# Patient Record
Sex: Female | Born: 1950
Health system: Southern US, Community
[De-identification: ages and names within clinical notes are randomized; demographics above are authoritative.]

## PROBLEM LIST (undated history)

## (undated) DIAGNOSIS — E7800 Pure hypercholesterolemia, unspecified: Secondary | ICD-10-CM

## (undated) HISTORY — PX: COLONOSCOPY: SHX174

## (undated) HISTORY — PX: TONSILLECTOMY: SUR1361

---

## 2014-09-09 ENCOUNTER — Other Ambulatory Visit: Payer: Self-pay | Admitting: Family Medicine

## 2014-09-09 ENCOUNTER — Ambulatory Visit
Admission: RE | Admit: 2014-09-09 | Discharge: 2014-09-09 | Disposition: A | Discharge status: Home / Self Care | Payer: BLUE CROSS/BLUE SHIELD | Source: Ambulatory Visit | Attending: Family Medicine | Admitting: Family Medicine

## 2014-09-09 DIAGNOSIS — R1900 Intra-abdominal and pelvic swelling, mass and lump, unspecified site: Secondary | ICD-10-CM

## 2016-12-02 ENCOUNTER — Encounter (HOSPITAL_COMMUNITY): Payer: Self-pay | Admitting: *Deleted

## 2016-12-02 ENCOUNTER — Emergency Department (HOSPITAL_COMMUNITY): Payer: BLUE CROSS/BLUE SHIELD

## 2016-12-02 ENCOUNTER — Emergency Department (HOSPITAL_COMMUNITY)
Admission: EM | Admit: 2016-12-02 | Discharge: 2016-12-02 | Disposition: A | Discharge status: Home / Self Care | Payer: BLUE CROSS/BLUE SHIELD | Attending: Emergency Medicine | Admitting: Emergency Medicine

## 2016-12-02 DIAGNOSIS — R002 Palpitations: Secondary | ICD-10-CM | POA: Diagnosis present

## 2016-12-02 DIAGNOSIS — Z79899 Other long term (current) drug therapy: Secondary | ICD-10-CM | POA: Insufficient documentation

## 2016-12-02 HISTORY — DX: Pure hypercholesterolemia, unspecified: E78.00

## 2016-12-02 LAB — T4, FREE: FREE T4: 1.01 ng/dL (ref 0.61–1.12)

## 2016-12-02 LAB — TSH: TSH: 2.881 u[IU]/mL (ref 0.350–4.500)

## 2016-12-02 LAB — CBC
HCT: 42.4 % (ref 36.0–46.0)
HEMOGLOBIN: 14 g/dL (ref 12.0–15.0)
MCH: 31 pg (ref 26.0–34.0)
MCHC: 33 g/dL (ref 30.0–36.0)
MCV: 93.8 fL (ref 78.0–100.0)
PLATELETS: 183 10*3/uL (ref 150–400)
RBC: 4.52 MIL/uL (ref 3.87–5.11)
RDW: 12.6 % (ref 11.5–15.5)
WBC: 5.2 10*3/uL (ref 4.0–10.5)

## 2016-12-02 LAB — BASIC METABOLIC PANEL
ANION GAP: 11 (ref 5–15)
BUN: 11 mg/dL (ref 6–20)
CALCIUM: 9.6 mg/dL (ref 8.9–10.3)
CO2: 26 mmol/L (ref 22–32)
CREATININE: 0.77 mg/dL (ref 0.44–1.00)
Chloride: 103 mmol/L (ref 101–111)
GFR calc Af Amer: >60 mL/min (ref >60)
GFR calc non Af Amer: >60 mL/min (ref >60)
GLUCOSE: 121 mg/dL — AB (ref 65–99)
Potassium: 3.8 mmol/L (ref 3.5–5.1)
Sodium: 140 mmol/L (ref 135–145)

## 2016-12-02 LAB — I-STAT TROPONIN, ED: TROPONIN I, POC: 0 ng/mL (ref 0.00–0.08)

## 2016-12-02 MED ORDER — SODIUM CHLORIDE 0.9 % IV BOLUS (SEPSIS): 1000.0000 mL | Freq: Once | INTRAVENOUS | Status: DC

## 2016-12-02 NOTE — ED Provider Notes (Signed)
[facility] DEPT Provider Note   CSN: [id number] Arrival date & time: 12/02/16  1354     History   Chief Complaint Chief Complaint  Patient presents with  . Palpitations    HPI Crystal Salas is a 66 y.o. female.  The history is provided by the patient.  Palpitations   This is a recurrent problem. The current episode started more than 2 days ago. The problem occurs every several days. The problem has not changed since onset.The problem is associated with caffeine. Pertinent negatives include no fever, no chest pain, no chest pressure, no exertional chest pressure, no near-syncope, no syncope, no abdominal pain, no nausea, no vomiting, no back pain, no leg pain, no lower extremity edema, no cough and no shortness of breath. She has tried nothing for the symptoms.   66 year old female with history of hyperlipidemia who presents with fluttering and in her left upper quadrant and low chest. She has had intermittent symptoms like this for the past 2 years. Has been ruled out for an aortic aneurysm by ultrasound 2 years ago by PCP for this. States the symptoms tend to be worse with certain foods and drinks such as caffeine and spicy foods and acidic foods. Denies any chest pain, difficulty breathing, syncope or near syncope, abdominal pain, nausea or vomiting, diarrhea or urinary complaints. She feels that her symptoms may have been more frequent over the past day or two. Past Medical History:  Diagnosis Date  . High cholesterol     There are no active problems to display for this patient.   History reviewed. No pertinent surgical history.  OB History    No data available       Home Medications    Prior to Admission medications   Medication Sig Start Date End Date Taking? Authorizing Provider  Omega-3 Fatty Acids (FISH OIL) 1000 MG CPDR Take 1,000 mg by mouth daily.   Yes [provider]  pravastatin (PRAVACHOL) 20 MG tablet Take 20 mg by mouth daily.   Yes  [provider]    Family History History reviewed. No pertinent family history.  Social History Social History  Substance Use Topics  . Smoking status: Never Smoker  . Smokeless tobacco: Not on file  . Alcohol use No     Allergies   Erythromycin   Review of Systems Review of Systems  Constitutional: Negative for fever.  Respiratory: Negative for cough and shortness of breath.   Cardiovascular: Positive for palpitations. Negative for chest pain, syncope and near-syncope.  Gastrointestinal: Negative for abdominal pain, nausea and vomiting.  Musculoskeletal: Negative for back pain.  All other systems reviewed and are negative.    Physical Exam Updated Vital Signs BP 96/68 (BP Location: Right Arm)   Pulse 70   Temp 98.3 F (36.8 C) (Oral)   Resp 20   SpO2 99%   Physical Exam Physical Exam  Nursing note and vitals reviewed. Constitutional: Well developed, well nourished, non-toxic, and in no acute distress Head: Normocephalic and atraumatic.  Mouth/Throat: Oropharynx is clear and moist.  Neck: Normal range of motion. Neck supple.  Cardiovascular: Normal rate and regular rhythm.   Pulmonary/Chest: Effort normal and breath sounds normal.  Abdominal: Soft. There is no tenderness. There is no rebound and no guarding.  Musculoskeletal: Normal range of motion.  Neurological: Alert, no facial droop, fluent speech, moves all extremities symmetrically Skin: Skin is warm and dry.  Psychiatric: Cooperative   ED Treatments / Results  Labs (all labs ordered  are listed, but only abnormal results are displayed) Labs Reviewed  BASIC METABOLIC PANEL - Abnormal; Notable for the following:       Result Value   Glucose, Bld 121 (*)    All other components within normal limits  CBC  TSH  T4, FREE  I-STAT TROPONIN, ED    EKG  EKG Interpretation  Date/Time:  Saturday December 02 2016 14:01:28 EDT Ventricular Rate:  70 PR Interval:  146 QRS Duration: 118 QT  Interval:  406 QTC Calculation: 438 R Axis:   79 Text Interpretation:  Normal sinus rhythm Possible Left atrial enlargement Incomplete right bundle branch block Borderline ECG no prior EKG Confirmed by Brantley Stage [phone]) on 12/02/2016 5:09:19 PM       Radiology Dg Chest 2 View  Result Date: 12/02/2016 CLINICAL DATA:  Palpitations for 2 days EXAM: CHEST  2 VIEW COMPARISON:  None. FINDINGS: Normal heart size and mediastinal contours. No acute infiltrate. Blunting of the lateral costophrenic sulci as attributed to degree of inflation. No fluid seen laterally. No edema or pneumothorax. No acute osseous findings. IMPRESSION: Negative chest. Electronically Signed   By: Monte Fantasia M.D.   On: 12/02/2016 15:45    Procedures Procedures (including critical care time)  Medications Ordered in ED Medications - No data to display   Initial Impression / Assessment and Plan / ED Course  I have reviewed the triage vital signs and the nursing notes.  Pertinent labs & imaging results that were available during my care of the patient were reviewed by me and considered in my medical decision making (see chart for details).     66 year old female who presents with intermittent palpitations in the left lower chest and left upper quadrant over the past 2 years. She is well-appearing and in no acute distress. Her vitals are stable. She did have the sensation while she was on cardiac monitoring and EKG does not show arrhythmia. There is incomplete right bundle-branch block, but otherwise no acute ischemic changes or other stigmata of arrhythmia. Blood work reassuring. Chest x-ray visualized and shows no acute cardiopulmonary processes. Her symptoms actually may be GI related as associated with specific foods. I have discussed potential medication such as PPIs with her but she would like to try diet changes first and follow-up closely with her PCP. I do not think that this is an acute cardiac process or there is  serious or emergent process. She has been ruled out for abdominal aortic aneurysm already as outpatient by PCP. Strict return and follow-up instructions reviewed. She expressed understanding of all discharge instructions and felt comfortable with the plan of care.   Final Clinical Impressions(s) / ED Diagnoses   Final diagnoses:  Palpitations    New Prescriptions New Prescriptions   No medications on file     Forde Dandy, MD 12/02/16 1850

## 2016-12-02 NOTE — Discharge Instructions (Signed)
Your palpitations may be related to indigestion symptoms. Cut back on acidic foods, caffeine, spicy foods and see if there is a difference. Please touch base with your PCP for follow-up and whether if you need Holter monitoring or other work-up Your blood work looks reassuring today. Your chest x-ray was normal.

## 2016-12-02 NOTE — ED Notes (Signed)
Resident at bedside.  

## 2016-12-02 NOTE — ED Triage Notes (Signed)
Pt reports hx of palpitations over past two years but has been more severe since Wed. Denies sob or CP. Skin w/d. ekg done at triage.

## 2017-01-24 ENCOUNTER — Other Ambulatory Visit: Payer: Self-pay | Admitting: Cardiology

## 2017-01-24 DIAGNOSIS — R1900 Intra-abdominal and pelvic swelling, mass and lump, unspecified site: Secondary | ICD-10-CM

## 2017-01-25 ENCOUNTER — Other Ambulatory Visit: Payer: Self-pay | Admitting: Cardiology

## 2017-02-09 ENCOUNTER — Ambulatory Visit
Admission: RE | Admit: 2017-02-09 | Discharge: 2017-02-09 | Disposition: A | Discharge status: Home / Self Care | Payer: BLUE CROSS/BLUE SHIELD | Source: Ambulatory Visit | Attending: Cardiology | Admitting: Cardiology

## 2017-02-09 DIAGNOSIS — R1900 Intra-abdominal and pelvic swelling, mass and lump, unspecified site: Secondary | ICD-10-CM

## 2017-12-13 DIAGNOSIS — Z01419 Encounter for gynecological examination (general) (routine) without abnormal findings: Secondary | ICD-10-CM | POA: Diagnosis not present

## 2018-02-06 DIAGNOSIS — Z23 Encounter for immunization: Secondary | ICD-10-CM | POA: Diagnosis not present

## 2018-02-06 DIAGNOSIS — Z0000 Encounter for general adult medical examination without abnormal findings: Secondary | ICD-10-CM | POA: Diagnosis not present

## 2018-02-06 DIAGNOSIS — M8580 Other specified disorders of bone density and structure, unspecified site: Secondary | ICD-10-CM | POA: Diagnosis not present

## 2018-02-06 DIAGNOSIS — E2839 Other primary ovarian failure: Secondary | ICD-10-CM | POA: Diagnosis not present

## 2018-02-06 DIAGNOSIS — Z1389 Encounter for screening for other disorder: Secondary | ICD-10-CM | POA: Diagnosis not present

## 2018-02-06 DIAGNOSIS — M25511 Pain in right shoulder: Secondary | ICD-10-CM | POA: Diagnosis not present

## 2018-02-06 DIAGNOSIS — Z6824 Body mass index (BMI) 24.0-24.9, adult: Secondary | ICD-10-CM | POA: Diagnosis not present

## 2018-02-06 DIAGNOSIS — K219 Gastro-esophageal reflux disease without esophagitis: Secondary | ICD-10-CM | POA: Diagnosis not present

## 2018-02-06 DIAGNOSIS — E7800 Pure hypercholesterolemia, unspecified: Secondary | ICD-10-CM | POA: Diagnosis not present

## 2018-03-04 DIAGNOSIS — M47812 Spondylosis without myelopathy or radiculopathy, cervical region: Secondary | ICD-10-CM | POA: Diagnosis not present

## 2018-03-04 DIAGNOSIS — M25512 Pain in left shoulder: Secondary | ICD-10-CM | POA: Diagnosis not present

## 2018-03-12 DIAGNOSIS — J069 Acute upper respiratory infection, unspecified: Secondary | ICD-10-CM | POA: Diagnosis not present

## 2018-04-25 DIAGNOSIS — M25512 Pain in left shoulder: Secondary | ICD-10-CM | POA: Diagnosis not present

## 2018-04-25 DIAGNOSIS — M542 Cervicalgia: Secondary | ICD-10-CM | POA: Diagnosis not present

## 2018-06-10 DIAGNOSIS — Z1231 Encounter for screening mammogram for malignant neoplasm of breast: Secondary | ICD-10-CM | POA: Diagnosis not present

## 2018-06-11 ENCOUNTER — Other Ambulatory Visit: Payer: Self-pay | Admitting: Family Medicine

## 2018-06-11 DIAGNOSIS — E2839 Other primary ovarian failure: Secondary | ICD-10-CM

## 2018-06-13 DIAGNOSIS — L600 Ingrowing nail: Secondary | ICD-10-CM | POA: Diagnosis not present

## 2018-06-13 DIAGNOSIS — M79674 Pain in right toe(s): Secondary | ICD-10-CM | POA: Diagnosis not present

## 2018-08-23 ENCOUNTER — Other Ambulatory Visit: Payer: BLUE CROSS/BLUE SHIELD

## 2018-10-21 ENCOUNTER — Other Ambulatory Visit: Payer: Self-pay

## 2018-10-21 ENCOUNTER — Ambulatory Visit
Admission: RE | Admit: 2018-10-21 | Discharge: 2018-10-21 | Disposition: A | Discharge status: Home / Self Care | Payer: BLUE CROSS/BLUE SHIELD | Source: Ambulatory Visit | Attending: Family Medicine | Admitting: Family Medicine

## 2018-10-21 DIAGNOSIS — E2839 Other primary ovarian failure: Secondary | ICD-10-CM

## 2018-10-21 DIAGNOSIS — M85852 Other specified disorders of bone density and structure, left thigh: Secondary | ICD-10-CM | POA: Diagnosis not present

## 2018-10-22 DIAGNOSIS — H524 Presbyopia: Secondary | ICD-10-CM | POA: Diagnosis not present

## 2018-10-22 DIAGNOSIS — H5203 Hypermetropia, bilateral: Secondary | ICD-10-CM | POA: Diagnosis not present

## 2018-10-22 DIAGNOSIS — Z135 Encounter for screening for eye and ear disorders: Secondary | ICD-10-CM | POA: Diagnosis not present

## 2018-10-22 DIAGNOSIS — H2513 Age-related nuclear cataract, bilateral: Secondary | ICD-10-CM | POA: Diagnosis not present

## 2018-10-22 DIAGNOSIS — H52223 Regular astigmatism, bilateral: Secondary | ICD-10-CM | POA: Diagnosis not present

## 2018-10-22 DIAGNOSIS — H43812 Vitreous degeneration, left eye: Secondary | ICD-10-CM | POA: Diagnosis not present

## 2019-01-01 DIAGNOSIS — Z01419 Encounter for gynecological examination (general) (routine) without abnormal findings: Secondary | ICD-10-CM | POA: Diagnosis not present

## 2019-02-05 DIAGNOSIS — Z23 Encounter for immunization: Secondary | ICD-10-CM | POA: Diagnosis not present

## 2019-03-03 DIAGNOSIS — E2839 Other primary ovarian failure: Secondary | ICD-10-CM | POA: Diagnosis not present

## 2019-03-03 DIAGNOSIS — Z1389 Encounter for screening for other disorder: Secondary | ICD-10-CM | POA: Diagnosis not present

## 2019-03-03 DIAGNOSIS — Z0000 Encounter for general adult medical examination without abnormal findings: Secondary | ICD-10-CM | POA: Diagnosis not present

## 2019-03-03 DIAGNOSIS — M25511 Pain in right shoulder: Secondary | ICD-10-CM | POA: Diagnosis not present

## 2019-03-03 DIAGNOSIS — Z23 Encounter for immunization: Secondary | ICD-10-CM | POA: Diagnosis not present

## 2019-03-03 DIAGNOSIS — K219 Gastro-esophageal reflux disease without esophagitis: Secondary | ICD-10-CM | POA: Diagnosis not present

## 2019-03-03 DIAGNOSIS — E7800 Pure hypercholesterolemia, unspecified: Secondary | ICD-10-CM | POA: Diagnosis not present

## 2019-03-03 DIAGNOSIS — Z6824 Body mass index (BMI) 24.0-24.9, adult: Secondary | ICD-10-CM | POA: Diagnosis not present

## 2019-03-03 DIAGNOSIS — Z1211 Encounter for screening for malignant neoplasm of colon: Secondary | ICD-10-CM | POA: Diagnosis not present

## 2019-03-03 DIAGNOSIS — R0989 Other specified symptoms and signs involving the circulatory and respiratory systems: Secondary | ICD-10-CM | POA: Diagnosis not present

## 2019-03-03 DIAGNOSIS — M8580 Other specified disorders of bone density and structure, unspecified site: Secondary | ICD-10-CM | POA: Diagnosis not present

## 2019-03-05 ENCOUNTER — Other Ambulatory Visit: Payer: Self-pay | Admitting: Family Medicine

## 2019-03-05 DIAGNOSIS — R0989 Other specified symptoms and signs involving the circulatory and respiratory systems: Secondary | ICD-10-CM

## 2019-03-06 ENCOUNTER — Encounter (INDEPENDENT_AMBULATORY_CARE_PROVIDER_SITE_OTHER): Payer: Self-pay | Admitting: *Deleted

## 2019-03-12 ENCOUNTER — Other Ambulatory Visit: Payer: Medicare Other

## 2019-03-25 DIAGNOSIS — L820 Inflamed seborrheic keratosis: Secondary | ICD-10-CM | POA: Diagnosis not present

## 2019-03-25 DIAGNOSIS — D1801 Hemangioma of skin and subcutaneous tissue: Secondary | ICD-10-CM | POA: Diagnosis not present

## 2019-03-25 DIAGNOSIS — D2262 Melanocytic nevi of left upper limb, including shoulder: Secondary | ICD-10-CM | POA: Diagnosis not present

## 2019-03-25 DIAGNOSIS — L570 Actinic keratosis: Secondary | ICD-10-CM | POA: Diagnosis not present

## 2019-03-25 DIAGNOSIS — L821 Other seborrheic keratosis: Secondary | ICD-10-CM | POA: Diagnosis not present

## 2019-03-25 DIAGNOSIS — L819 Disorder of pigmentation, unspecified: Secondary | ICD-10-CM | POA: Diagnosis not present

## 2019-03-25 DIAGNOSIS — L813 Cafe au lait spots: Secondary | ICD-10-CM | POA: Diagnosis not present

## 2019-03-25 DIAGNOSIS — D2261 Melanocytic nevi of right upper limb, including shoulder: Secondary | ICD-10-CM | POA: Diagnosis not present

## 2019-03-25 DIAGNOSIS — D225 Melanocytic nevi of trunk: Secondary | ICD-10-CM | POA: Diagnosis not present

## 2019-03-25 DIAGNOSIS — L720 Epidermal cyst: Secondary | ICD-10-CM | POA: Diagnosis not present

## 2019-03-25 DIAGNOSIS — L814 Other melanin hyperpigmentation: Secondary | ICD-10-CM | POA: Diagnosis not present

## 2019-04-02 ENCOUNTER — Ambulatory Visit
Admission: RE | Admit: 2019-04-02 | Discharge: 2019-04-02 | Disposition: A | Discharge status: Home / Self Care | Payer: Medicare Other | Source: Ambulatory Visit | Attending: Family Medicine | Admitting: Family Medicine

## 2019-04-02 ENCOUNTER — Other Ambulatory Visit: Payer: Self-pay | Admitting: Family Medicine

## 2019-04-02 DIAGNOSIS — R0989 Other specified symptoms and signs involving the circulatory and respiratory systems: Secondary | ICD-10-CM | POA: Diagnosis not present

## 2019-05-08 DIAGNOSIS — R233 Spontaneous ecchymoses: Secondary | ICD-10-CM | POA: Diagnosis not present

## 2019-05-30 DIAGNOSIS — Z23 Encounter for immunization: Secondary | ICD-10-CM | POA: Diagnosis not present

## 2019-06-18 DIAGNOSIS — Z23 Encounter for immunization: Secondary | ICD-10-CM | POA: Diagnosis not present

## 2019-08-07 DIAGNOSIS — Z1231 Encounter for screening mammogram for malignant neoplasm of breast: Secondary | ICD-10-CM | POA: Diagnosis not present

## 2019-08-27 ENCOUNTER — Other Ambulatory Visit (INDEPENDENT_AMBULATORY_CARE_PROVIDER_SITE_OTHER): Payer: Self-pay | Admitting: *Deleted

## 2019-08-28 ENCOUNTER — Other Ambulatory Visit (INDEPENDENT_AMBULATORY_CARE_PROVIDER_SITE_OTHER): Payer: Self-pay | Admitting: *Deleted

## 2019-08-28 DIAGNOSIS — Z1211 Encounter for screening for malignant neoplasm of colon: Secondary | ICD-10-CM

## 2019-08-28 DIAGNOSIS — L03031 Cellulitis of right toe: Secondary | ICD-10-CM | POA: Diagnosis not present

## 2019-08-28 DIAGNOSIS — G5761 Lesion of plantar nerve, right lower limb: Secondary | ICD-10-CM | POA: Diagnosis not present

## 2019-08-28 DIAGNOSIS — M79671 Pain in right foot: Secondary | ICD-10-CM | POA: Diagnosis not present

## 2019-09-02 ENCOUNTER — Encounter (INDEPENDENT_AMBULATORY_CARE_PROVIDER_SITE_OTHER): Payer: Self-pay | Admitting: *Deleted

## 2019-09-03 ENCOUNTER — Ambulatory Visit (INDEPENDENT_AMBULATORY_CARE_PROVIDER_SITE_OTHER): Payer: Self-pay

## 2019-09-03 ENCOUNTER — Other Ambulatory Visit: Payer: Self-pay

## 2019-09-03 ENCOUNTER — Telehealth (INDEPENDENT_AMBULATORY_CARE_PROVIDER_SITE_OTHER): Payer: Self-pay | Admitting: *Deleted

## 2019-09-03 NOTE — Telephone Encounter (Signed)
Referring MD/PCP: reade   Procedure: tcs  Reason/Indication:  screening  Has patient had this procedure before?  Yes, 2010  If so, when, by whom and where?    Is there a family history of colon cancer?  no  Who?  What age when diagnosed?    Is patient diabetic?   no      Does patient have prosthetic heart valve or mechanical valve?  no  Do you have a pacemaker/defibrillator?  no  Has patient ever had endocarditis/atrial fibrillation? no  Does patient use oxygen? no  Has patient had joint replacement within last 12 months?  no  Is patient constipated or do they take laxatives? no  Does patient have a history of alcohol/drug use?  no  Is patient on blood thinner such as Coumadin, Plavix and/or Aspirin? no  Medications: pravastatin 20 mg daily, centrum silver daily, lutein daily, fish oil daily, zinc daily, vit d3 daily, probiotic daily, caltrate daily  Allergies: erythromycin  Medication Adjustment per Dr Laural Golden:   Procedure date & time: 10/01/19

## 2019-09-05 ENCOUNTER — Telehealth (INDEPENDENT_AMBULATORY_CARE_PROVIDER_SITE_OTHER): Payer: Self-pay | Admitting: *Deleted

## 2019-09-05 MED ORDER — SUTAB 1479-225-188 MG PO TABS: 1.0000 | ORAL_TABLET | Freq: Once | ORAL | 0 refills | Status: AC

## 2019-09-05 NOTE — Telephone Encounter (Signed)
Ok to schedule with conscious sedation

## 2019-09-05 NOTE — Telephone Encounter (Signed)
Patient needs Sutab (copay card) ° °

## 2019-09-30 ENCOUNTER — Other Ambulatory Visit: Payer: Self-pay

## 2019-09-30 ENCOUNTER — Other Ambulatory Visit (HOSPITAL_COMMUNITY)
Admission: RE | Admit: 2019-09-30 | Discharge: 2019-09-30 | Disposition: A | Discharge status: Home / Self Care | Payer: Medicare Other | Source: Ambulatory Visit | Attending: Internal Medicine | Admitting: Internal Medicine

## 2019-09-30 DIAGNOSIS — Z01812 Encounter for preprocedural laboratory examination: Secondary | ICD-10-CM | POA: Insufficient documentation

## 2019-09-30 DIAGNOSIS — Z20822 Contact with and (suspected) exposure to covid-19: Secondary | ICD-10-CM | POA: Diagnosis not present

## 2019-10-01 ENCOUNTER — Other Ambulatory Visit: Payer: Self-pay

## 2019-10-01 ENCOUNTER — Encounter (HOSPITAL_COMMUNITY)
Admission: RE | Disposition: A | Discharge status: Home / Self Care | Payer: Self-pay | Source: Home / Self Care | Attending: Internal Medicine

## 2019-10-01 ENCOUNTER — Ambulatory Visit (HOSPITAL_COMMUNITY)
Admission: RE | Admit: 2019-10-01 | Discharge: 2019-10-01 | Disposition: A | Discharge status: Home / Self Care | Payer: Medicare Other | Attending: Internal Medicine | Admitting: Internal Medicine

## 2019-10-01 ENCOUNTER — Encounter (HOSPITAL_COMMUNITY): Payer: Self-pay | Admitting: Internal Medicine

## 2019-10-01 DIAGNOSIS — Z1211 Encounter for screening for malignant neoplasm of colon: Secondary | ICD-10-CM | POA: Diagnosis not present

## 2019-10-01 DIAGNOSIS — E7800 Pure hypercholesterolemia, unspecified: Secondary | ICD-10-CM | POA: Diagnosis not present

## 2019-10-01 DIAGNOSIS — Z79899 Other long term (current) drug therapy: Secondary | ICD-10-CM | POA: Diagnosis not present

## 2019-10-01 DIAGNOSIS — K644 Residual hemorrhoidal skin tags: Secondary | ICD-10-CM | POA: Diagnosis not present

## 2019-10-01 DIAGNOSIS — Z20822 Contact with and (suspected) exposure to covid-19: Secondary | ICD-10-CM | POA: Diagnosis not present

## 2019-10-01 HISTORY — PX: COLONOSCOPY: SHX5424

## 2019-10-01 LAB — SARS CORONAVIRUS 2 (TAT 6-24 HRS): SARS Coronavirus 2: NEGATIVE

## 2019-10-01 SURGERY — COLONOSCOPY
Anesthesia: Moderate Sedation

## 2019-10-01 MED ORDER — MEPERIDINE HCL 50 MG/ML IJ SOLN
INTRAMUSCULAR | Status: DC | PRN
  Administered 2019-10-01 (×2): 25 mg via INTRAVENOUS

## 2019-10-01 MED ORDER — MIDAZOLAM HCL 5 MG/5ML IJ SOLN
INTRAMUSCULAR | Status: DC | PRN
  Administered 2019-10-01 (×2): 2 mg via INTRAVENOUS
  Administered 2019-10-01 (×2): 1 mg via INTRAVENOUS

## 2019-10-01 MED ORDER — NYSTATIN-TRIAMCINOLONE 100000-0.1 UNIT/GM-% EX CREA: TOPICAL_CREAM | CUTANEOUS | 1 refills | Status: AC

## 2019-10-01 MED ORDER — STERILE WATER FOR IRRIGATION IR SOLN
Status: DC | PRN
  Administered 2019-10-01: 1.5 mL

## 2019-10-01 MED ORDER — SODIUM CHLORIDE 0.9 % IV SOLN
INTRAVENOUS | Status: DC
  Administered 2019-10-01: 1000 mL via INTRAVENOUS

## 2019-10-01 MED ORDER — MIDAZOLAM HCL 5 MG/5ML IJ SOLN
INTRAMUSCULAR | Status: AC
  Filled 2019-10-01: qty 10

## 2019-10-01 MED ORDER — MEPERIDINE HCL 50 MG/ML IJ SOLN
INTRAMUSCULAR | Status: AC
  Filled 2019-10-01: qty 1

## 2019-10-01 NOTE — H&P (Signed)
Crystal Salas is an 69 y.o. female.   Chief Complaint: Patient is here for colonoscopy. HPI: Patient is 69 year old Caucasian female who is here for screening colonoscopy.  Her last exam was 11 years ago in [place] and was normal.  She denies recent change in her bowel habits or rectal bleeding.  She has had issues with constipation since she went on pravastatin.  She is able to control it by taking Metamucil and watching her diet.  She has a history of hemorrhoids.  If you become constipated she emergency blood in the tissue or local irritation. Family history is negative for CRC. She may take 3-4 doses of Aleve per month.  Past Medical History:  Diagnosis Date  . High cholesterol     Past Surgical History:  Procedure Laterality Date  . COLONOSCOPY    . TONSILLECTOMY      History reviewed. No pertinent family history. Social History:  reports that she has never smoked. She has never used smokeless tobacco. She reports that she does not drink alcohol or use drugs.  Allergies:  Allergies  Allergen Reactions  . Erythromycin Diarrhea    Painful stomach cramps    Medications Prior to Admission  Medication Sig Dispense Refill  . Calcium Citrate-Vitamin D (CITRACAL + D PO) Take 1 tablet by mouth 2 (two) times daily.    . Cholecalciferol (VITAMIN D) 50 MCG (2000 UT) tablet Take 2,000 Units by mouth daily.    . Flaxseed, Linseed, (FLAX SEED OIL) 1300 MG CAPS Take 1,300 mg by mouth 2 (two) times daily.    . Inulin (FIBER CHOICE PO) Take 1 tablet by mouth at bedtime.    . Lutein 20 MG CAPS Take 20 mg by mouth daily.    . Magnesium 250 MG TABS Take 250 mg by mouth at bedtime.    . Multiple Vitamins-Minerals (CENTRUM SILVER PO) Take 1 tablet by mouth daily.    . Omega-3 Fatty Acids (FISH OIL) 1200 MG CAPS Take 1,200 mg by mouth daily.    Marland Kitchen OVER THE COUNTER MEDICATION Take 1 Dose by mouth daily. Lifevantage daily wellness powder    . OVER THE COUNTER MEDICATION Take 1 tablet  by mouth daily in the afternoon. lifevantage protandim nrf2    . pravastatin (PRAVACHOL) 20 MG tablet Take 20 mg by mouth at bedtime.     . Probiotic CAPS Take 1 capsule by mouth daily.    . Zinc 25 MG TABS Take 25 mg by mouth daily.    . naproxen sodium (ALEVE) 220 MG tablet Take 220 mg by mouth daily as needed (pain).      Results for orders placed or performed during the hospital encounter of 09/30/19 (from the past 48 hour(s))  SARS CORONAVIRUS 2 (TAT 6-24 HRS) Nasopharyngeal Nasopharyngeal Swab     Status: None   Collection Time: 09/30/19  8:03 AM   Specimen: Nasopharyngeal Swab  Result Value Ref Range   SARS Coronavirus 2 NEGATIVE NEGATIVE    Comment: (NOTE) SARS-CoV-2 target nucleic acids are NOT DETECTED. The SARS-CoV-2 RNA is generally detectable in upper and lower respiratory specimens during the acute phase of infection. Negative results do not preclude SARS-CoV-2 infection, do not rule out co-infections with other pathogens, and should not be used as the sole basis for treatment or other patient management decisions. Negative results must be combined with clinical observations, patient history, and epidemiological information. The expected result is Negative. Fact Sheet for Patients: SugarRoll.be Fact Sheet for Healthcare Providers:  https://www.woods-mathews.com/ This test is not yet approved or cleared by the [place] and  has been authorized for detection and/or diagnosis of SARS-CoV-2 by FDA under an Emergency Use Authorization (EUA). This EUA will remain  in effect (meaning this test can be used) for the duration of the COVID-19 declaration under Section 56 4(b)(1) of the Act, 21 U.S.C. section 360bbb-3(b)(1), unless the authorization is terminated or revoked sooner. Performed at [facility], [place] [address], [place], [place] [address]    No results found.  Review of Systems  Blood pressure 112/72, pulse  72, temperature 98.1 F (36.7 C), temperature source Oral, resp. rate 20, height 5' 7.5" (1.715 m), weight 73.5 kg, SpO2 100 %. Physical Exam  Constitutional: She appears well-developed and well-nourished.  HENT:  Mouth/Throat: Oropharynx is clear and moist.  Eyes: Conjunctivae are normal. No scleral icterus.  Neck: No thyromegaly present.  Cardiovascular: Normal rate, regular rhythm and normal heart sounds.  No murmur heard. Respiratory: Effort normal and breath sounds normal.  GI:  Abdomen is flat, soft and nontender.  Aorta is palpable but not expanded.  No organomegaly masses  Musculoskeletal:        General: No edema.  Lymphadenopathy:    She has no cervical adenopathy.  Neurological: She is alert.  Skin: Skin is warm and dry.     Assessment/Plan  Average risk screening colonoscopy.  Hildred Laser, MD 10/01/2019, 8:24 AM

## 2019-10-01 NOTE — Discharge Instructions (Signed)
Resume usual medications as before. Mycolog-II cream to be applied to anal canal twice daily for 1 week and thereafter on as-needed basis. High-fiber diet No driving for 24 hours. Next screening exam in 10 years.   Colonoscopy, Adult, Care After This sheet gives you information about how to care for yourself after your procedure. Your doctor may also give you more specific instructions. If you have problems or questions, call your doctor. What can I expect after the procedure? After the procedure, it is common to have:  A small amount of blood in your poop (stool) for 24 hours.  Some gas.  Mild cramping or bloating in your belly (abdomen). Follow these instructions at home: Eating and drinking   Drink enough fluid to keep your pee (urine) pale yellow.  Follow instructions from your doctor about what you cannot eat or drink.  Return to your normal diet as told by your doctor. Avoid heavy or fried foods that are hard to digest. Activity  Rest as told by your doctor.  Do not sit for a long time without moving. Get up to take short walks every 1-2 hours. This is important. Ask for help if you feel weak or unsteady.  Return to your normal activities as told by your doctor. Ask your doctor what activities are safe for you. To help cramping and bloating:   Try walking around.  Put heat on your belly as told by your doctor. Use the heat source that your doctor recommends, such as a moist heat pack or a heating pad. ? Put a towel between your skin and the heat source. ? Leave the heat on for 20-30 minutes. ? Remove the heat if your skin turns bright red. This is very important if you are unable to feel pain, heat, or cold. You may have a greater risk of getting burned. General instructions  For the first 24 hours after the procedure: ? Do not drive or use machinery. ? Do not sign important documents. ? Do not drink alcohol. ? Do your daily activities more slowly than  normal. ? Eat foods that are soft and easy to digest.  Take over-the-counter or prescription medicines only as told by your doctor.  Keep all follow-up visits as told by your doctor. This is important. Contact a doctor if:  You have blood in your poop 2-3 days after the procedure. Get help right away if:  You have more than a small amount of blood in your poop.  You see large clumps of tissue (blood clots) in your poop.  Your belly is swollen.  You feel like you may vomit (nauseous).  You vomit.  You have a fever.  You have belly pain that gets worse, and medicine does not help your pain. Summary  After the procedure, it is common to have a small amount of blood in your poop. You may also have mild cramping and bloating in your belly.  For the first 24 hours after the procedure, do not drive or use machinery, do not sign important documents, and do not drink alcohol.  Get help right away if you have a lot of blood in your poop, feel like you may vomit, have a fever, or have more belly pain. This information is not intended to replace advice given to you by your health care provider. Make sure you discuss any questions you have with your health care provider. Document Revised: 11/11/2018 Document Reviewed: 11/11/2018 Elsevier Patient Education  [place].  Hemorrhoids Hemorrhoids are swollen veins in and around the rectum or anus. There are two types of hemorrhoids:  Internal hemorrhoids. These occur in the veins that are just inside the rectum. They may poke through to the outside and become irritated and painful.  External hemorrhoids. These occur in the veins that are outside the anus and can be felt as a painful swelling or hard lump near the anus. Most hemorrhoids do not cause serious problems, and they can be managed with home treatments such as diet and lifestyle changes. If home treatments do not help the symptoms, procedures can be done to shrink or remove  the hemorrhoids. What are the causes? This condition is caused by increased pressure in the anal area. This pressure may result from various things, including:  Constipation.  Straining to have a bowel movement.  Diarrhea.  Pregnancy.  Obesity.  Sitting for long periods of time.  Heavy lifting or other activity that causes you to strain.  Anal sex.  Riding a bike for a long period of time. What are the signs or symptoms? Symptoms of this condition include:  Pain.  Anal itching or irritation.  Rectal bleeding.  Leakage of stool (feces).  Anal swelling.  One or more lumps around the anus. How is this diagnosed? This condition can often be diagnosed through a visual exam. Other exams or tests may also be done, such as:  An exam that involves feeling the rectal area with a gloved hand (digital rectal exam).  An exam of the anal canal that is done using a small tube (anoscope).  A blood test, if you have lost a significant amount of blood.  A test to look inside the colon using a flexible tube with a camera on the end (sigmoidoscopy or colonoscopy). How is this treated? This condition can usually be treated at home. However, various procedures may be done if dietary changes, lifestyle changes, and other home treatments do not help your symptoms. These procedures can help make the hemorrhoids smaller or remove them completely. Some of these procedures involve surgery, and others do not. Common procedures include:  Rubber band ligation. Rubber bands are placed at the base of the hemorrhoids to cut off their blood supply.  Sclerotherapy. Medicine is injected into the hemorrhoids to shrink them.  Infrared coagulation. A type of light energy is used to get rid of the hemorrhoids.  Hemorrhoidectomy surgery. The hemorrhoids are surgically removed, and the veins that supply them are tied off.  Stapled hemorrhoidopexy surgery. The surgeon staples the base of the hemorrhoid  to the rectal wall. Follow these instructions at home: Eating and drinking   Eat foods that have a lot of fiber in them, such as whole grains, beans, nuts, fruits, and vegetables.  Ask your health care provider about taking products that have added fiber (fiber supplements).  Reduce the amount of fat in your diet. You can do this by eating low-fat dairy products, eating less red meat, and avoiding processed foods.  Drink enough fluid to keep your urine pale yellow. Managing pain and swelling   Take warm sitz baths for 20 minutes, 3-4 times a day to ease pain and discomfort. You may do this in a bathtub or using a portable sitz bath that fits over the toilet.  If directed, apply ice to the affected area. Using ice packs between sitz baths may be helpful. ? Put ice in a plastic bag. ? Place a towel between your skin and the bag. ?  Leave the ice on for 20 minutes, 2-3 times a day. General instructions  Take over-the-counter and prescription medicines only as told by your health care provider.  Use medicated creams or suppositories as told.  Get regular exercise. Ask your health care provider how much and what kind of exercise is best for you. In general, you should do moderate exercise for at least 30 minutes on most days of the week (150 minutes each week). This can include activities such as walking, biking, or yoga.  Go to the bathroom when you have the urge to have a bowel movement. Do not wait.  Avoid straining to have bowel movements.  Keep the anal area dry and clean. Use wet toilet paper or moist towelettes after a bowel movement.  Do not sit on the toilet for long periods of time. This increases blood pooling and pain.  Keep all follow-up visits as told by your health care provider. This is important. Contact a health care provider if you have:  Increasing pain and swelling that are not controlled by treatment or medicine.  Difficulty having a bowel movement, or you  are unable to have a bowel movement.  Pain or inflammation outside the area of the hemorrhoids. Get help right away if you have:  Uncontrolled bleeding from your rectum. Summary  Hemorrhoids are swollen veins in and around the rectum or anus.  Most hemorrhoids can be managed with home treatments such as diet and lifestyle changes.  Taking warm sitz baths can help ease pain and discomfort.  In severe cases, procedures or surgery can be done to shrink or remove the hemorrhoids. This information is not intended to replace advice given to you by your health care provider. Make sure you discuss any questions you have with your health care provider. Document Revised: 09/13/2018 Document Reviewed: 09/06/2017 Elsevier Patient Education  [place].   High-Fiber Diet Fiber, also called dietary fiber, is a type of carbohydrate that is found in fruits, vegetables, whole grains, and beans. A high-fiber diet can have many health benefits. Your health care provider may recommend a high-fiber diet to help:  Prevent constipation. Fiber can make your bowel movements more regular.  Lower your cholesterol.  Relieve the following conditions: ? Swelling of veins in the anus (hemorrhoids). ? Swelling and irritation (inflammation) of specific areas of the digestive tract (uncomplicated diverticulosis). ? A problem of the large intestine (colon) that sometimes causes pain and diarrhea (irritable bowel syndrome, IBS).  Prevent overeating as part of a weight-loss plan.  Prevent heart disease, type 2 diabetes, and certain cancers. What is my plan? The recommended daily fiber intake in grams (g) includes:  38 g for men age 39 or younger.  30 g for men over age 48.  7 g for women age 91 or younger.  21 g for women over age 70. You can get the recommended daily intake of dietary fiber by:  Eating a variety of fruits, vegetables, grains, and beans.  Taking a fiber supplement, if it is not  possible to get enough fiber through your diet. What do I need to know about a high-fiber diet?  It is better to get fiber through food sources rather than from fiber supplements. There is not a lot of research about how effective supplements are.  Always check the fiber content on the nutrition facts label of any prepackaged food. Look for foods that contain 5 g of fiber or more per serving.  Talk with a diet and  nutrition specialist (dietitian) if you have questions about specific foods that are recommended or not recommended for your medical condition, especially if those foods are not listed below.  Gradually increase how much fiber you consume. If you increase your intake of dietary fiber too quickly, you may have bloating, cramping, or gas.  Drink plenty of water. Water helps you to digest fiber. What are tips for following this plan?  Eat a wide variety of high-fiber foods.  Make sure that half of the grains that you eat each day are whole grains.  Eat breads and cereals that are made with whole-grain flour instead of refined flour or white flour.  Eat brown rice, bulgur wheat, or millet instead of white rice.  Start the day with a breakfast that is high in fiber, such as a cereal that contains 5 g of fiber or more per serving.  Use beans in place of meat in soups, salads, and pasta dishes.  Eat high-fiber snacks, such as berries, raw vegetables, nuts, and popcorn.  Choose whole fruits and vegetables instead of processed forms like juice or sauce. What foods can I eat?  Fruits Berries. Pears. Apples. Oranges. Avocado. Prunes and raisins. Dried figs. Vegetables Sweet potatoes. Spinach. Kale. Artichokes. Cabbage. Broccoli. Cauliflower. Green peas. Carrots. Squash. Grains Whole-grain breads. Multigrain cereal. Oats and oatmeal. Brown rice. Barley. Bulgur wheat. [place]. Quinoa. Bran muffins. Popcorn. Rye wafer crackers. Meats and other proteins [place], kidney, and pinto beans.  Soybeans. Split peas. Lentils. Nuts and seeds. Dairy Fiber-fortified yogurt. Beverages Fiber-fortified soy milk. Fiber-fortified orange juice. Other foods Fiber bars. The items listed above may not be a complete list of recommended foods and beverages. Contact a dietitian for more options. What foods are not recommended? Fruits Fruit juice. Cooked, strained fruit. Vegetables Fried potatoes. Canned vegetables. Well-cooked vegetables. Grains White bread. Pasta made with refined flour. White rice. Meats and other proteins Fatty cuts of meat. Fried chicken or fried fish. Dairy Milk. Yogurt. Cream cheese. Sour cream. Fats and oils Butters. Beverages Soft drinks. Other foods Cakes and pastries. The items listed above may not be a complete list of foods and beverages to avoid. Contact a dietitian for more information. Summary  Fiber is a type of carbohydrate. It is found in fruits, vegetables, whole grains, and beans.  There are many health benefits of eating a high-fiber diet, such as preventing constipation, lowering blood cholesterol, helping with weight loss, and reducing your risk of heart disease, diabetes, and certain cancers.  Gradually increase your intake of fiber. Increasing too fast can result in cramping, bloating, and gas. Drink plenty of water while you increase your fiber.  The best sources of fiber include whole fruits and vegetables, whole grains, nuts, seeds, and beans. This information is not intended to replace advice given to you by your health care provider. Make sure you discuss any questions you have with your health care provider. Document Revised: 02/19/2017 Document Reviewed: 02/19/2017 Elsevier Patient Education  2020 [employer].

## 2019-10-01 NOTE — Op Note (Signed)
[facility] Patient Name: Crystal Salas Procedure Date: 10/01/2019 8:09 AM MRN: [id number] Date of Birth: 09-17-1950 Attending MD: Hildred Laser , MD CSN: [id number] Age: 69 Admit Type: Outpatient Procedure:                Colonoscopy Indications:              Screening for colorectal malignant neoplasm Providers:                Hildred Laser, MD, Otis Peak B. Sharon Seller, RN, Raphael Gibney, Technician Referring MD:             Maury Dus, MD Medicines:                Meperidine 50 mg IV, Midazolam 6 mg IV Complications:            No immediate complications. Estimated Blood Loss:     Estimated blood loss: none. Procedure:                Pre-Anesthesia Assessment:                           - Prior to the procedure, a History and Physical                            was performed, and patient medications and                            allergies were reviewed. The patient's tolerance of                            previous anesthesia was also reviewed. The risks                            and benefits of the procedure and the sedation                            options and risks were discussed with the patient.                            All questions were answered, and informed consent                            was obtained. Prior Anticoagulants: The patient has                            taken no previous anticoagulant or antiplatelet                            agents except for NSAID medication. ASA Grade                            Assessment: I - A normal, healthy patient. After  reviewing the risks and benefits, the patient was                            deemed in satisfactory condition to undergo the                            procedure.                           After obtaining informed consent, the colonoscope                            was passed under direct vision. Throughout the                            procedure, the  patient's blood pressure, pulse, and                            oxygen saturations were monitored continuously. The                            [id number]) scope was introduced through                            the anus and advanced to the the cecum, identified                            by appendiceal orifice and ileocecal valve. The                            colonoscopy was somewhat difficult due to a                            tortuous sigmoid colon and redundant transverse                            colon. Successful completion of the procedure was                            aided by using manual pressure and scope guide. The                            patient tolerated the procedure well. The quality                            of the bowel preparation was good. The ileocecal                            valve, appendiceal orifice, and rectum were                            photographed. Scope In: 8:36:56 AM Scope Out: 8:57:32 AM Scope Withdrawal Time: 0 hours 8 minutes 42 seconds  Total Procedure Duration: 0 hours 20 minutes 36  seconds  Findings:      The perianal and digital rectal examinations were normal.      The colon (entire examined portion) appeared normal.      External hemorrhoids were found during retroflexion. The hemorrhoids       were medium-sized. Impression:               - The entire examined colon is normal.                           - External hemorrhoids.                           - No specimens collected. Moderate Sedation:      Moderate (conscious) sedation was administered by the endoscopy nurse       and supervised by the endoscopist. The following parameters were       monitored: oxygen saturation, heart rate, blood pressure, CO2       capnography and response to care. Total physician intraservice time was       28 minutes. Recommendation:           - Patient has a contact number available for                            emergencies. The signs and  symptoms of potential                            delayed complications were discussed with the                            patient. Return to normal activities tomorrow.                            Written discharge instructions were provided to the                            patient.                           - High fiber diet today.                           - Continue present medications.                           - Use Mycolog II cream as directed.                           - Repeat colonoscopy in 10 years for screening                            purposes. Procedure Code(s):        --- Professional ---                           [phone], Colonoscopy, flexible; diagnostic, including  collection of specimen(s) by brushing or washing,                            when performed (separate procedure)                           99153, Moderate sedation; each additional 15                            minutes intraservice time                           G0500, Moderate sedation services provided by the                            same physician or other qualified health care                            professional performing a gastrointestinal                            endoscopic service that sedation supports,                            requiring the presence of an independent trained                            observer to assist in the monitoring of the                            patient's level of consciousness and physiological                            status; initial 15 minutes of intra-service time;                            patient age 68 years or older (additional time may                            be reported with [phone], as appropriate) Diagnosis Code(s):        --- Professional ---                           Z12.11, Encounter for screening for malignant                            neoplasm of colon                           K64.4, Residual hemorrhoidal skin  tags CPT copyright 2019 American Medical Association. All rights reserved. The codes documented in this report are preliminary and upon coder review may  be revised to meet current compliance requirements. Hildred Laser, MD Hildred Laser, MD 10/01/2019 9:08:37 AM This report has been signed electronically. Number of Addenda: 0

## 2019-10-02 DIAGNOSIS — M2011 Hallux valgus (acquired), right foot: Secondary | ICD-10-CM | POA: Diagnosis not present

## 2019-10-02 DIAGNOSIS — M79671 Pain in right foot: Secondary | ICD-10-CM | POA: Diagnosis not present

## 2019-10-02 DIAGNOSIS — G5761 Lesion of plantar nerve, right lower limb: Secondary | ICD-10-CM | POA: Diagnosis not present

## 2019-12-09 DIAGNOSIS — H52223 Regular astigmatism, bilateral: Secondary | ICD-10-CM | POA: Diagnosis not present

## 2019-12-09 DIAGNOSIS — H5203 Hypermetropia, bilateral: Secondary | ICD-10-CM | POA: Diagnosis not present

## 2019-12-09 DIAGNOSIS — H2513 Age-related nuclear cataract, bilateral: Secondary | ICD-10-CM | POA: Diagnosis not present

## 2019-12-09 DIAGNOSIS — H43813 Vitreous degeneration, bilateral: Secondary | ICD-10-CM | POA: Diagnosis not present

## 2019-12-09 DIAGNOSIS — H524 Presbyopia: Secondary | ICD-10-CM | POA: Diagnosis not present

## 2020-02-26 DIAGNOSIS — Z23 Encounter for immunization: Secondary | ICD-10-CM | POA: Diagnosis not present

## 2020-03-08 DIAGNOSIS — M25511 Pain in right shoulder: Secondary | ICD-10-CM | POA: Diagnosis not present

## 2020-03-08 DIAGNOSIS — K219 Gastro-esophageal reflux disease without esophagitis: Secondary | ICD-10-CM | POA: Diagnosis not present

## 2020-03-08 DIAGNOSIS — M8580 Other specified disorders of bone density and structure, unspecified site: Secondary | ICD-10-CM | POA: Diagnosis not present

## 2020-03-08 DIAGNOSIS — R0989 Other specified symptoms and signs involving the circulatory and respiratory systems: Secondary | ICD-10-CM | POA: Diagnosis not present

## 2020-03-08 DIAGNOSIS — Z1389 Encounter for screening for other disorder: Secondary | ICD-10-CM | POA: Diagnosis not present

## 2020-03-08 DIAGNOSIS — E2839 Other primary ovarian failure: Secondary | ICD-10-CM | POA: Diagnosis not present

## 2020-03-08 DIAGNOSIS — E7800 Pure hypercholesterolemia, unspecified: Secondary | ICD-10-CM | POA: Diagnosis not present

## 2020-03-08 DIAGNOSIS — Z0000 Encounter for general adult medical examination without abnormal findings: Secondary | ICD-10-CM | POA: Diagnosis not present

## 2020-04-02 IMAGING — US US AORTA
1 series · 14 of 25 positions shown · non-contrast
Comparison: 02/09/2017

CLINICAL DATA: Abdominal bruit.

EXAM:
ULTRASOUND OF ABDOMINAL AORTA
TECHNIQUE: Ultrasound examination of the abdominal aorta and proximal common
iliac arteries was performed to evaluate for aneurysm. Additional
color and Doppler images of the distal aorta were obtained to
document patency.

[Series 1: us aorta · 0.23mm/px · 14 of 28 slices shown]
[im 1/28]
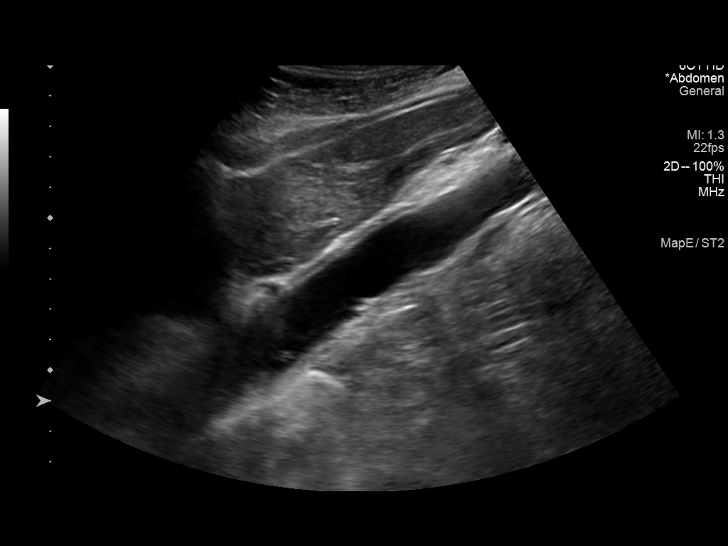
[im 3/28]
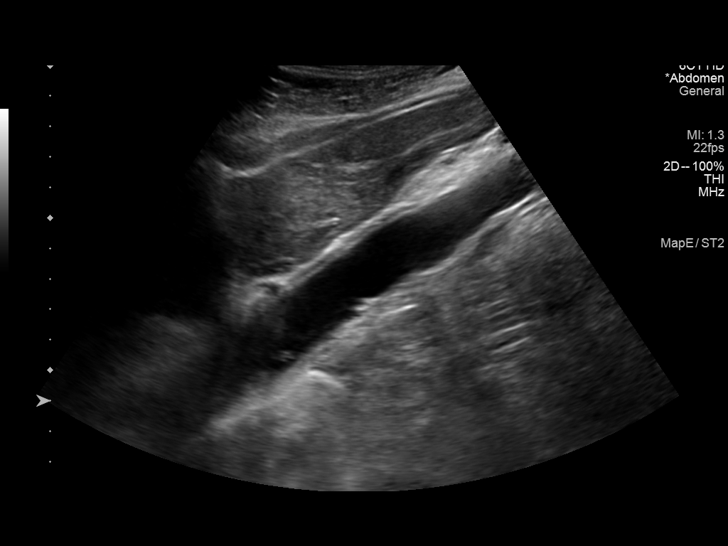
[im 5/28]
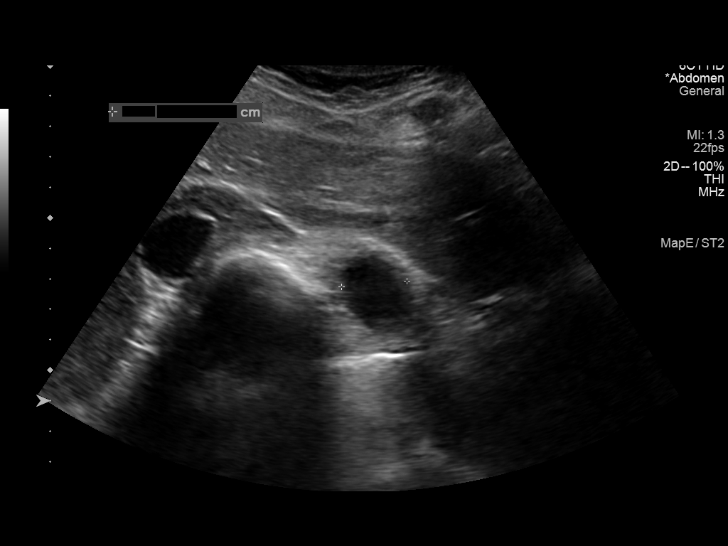
[im 7/28]
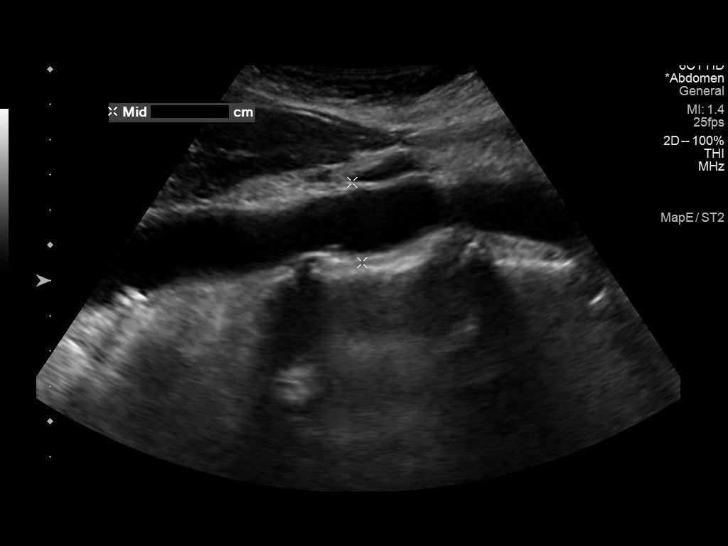
[im 10/28]
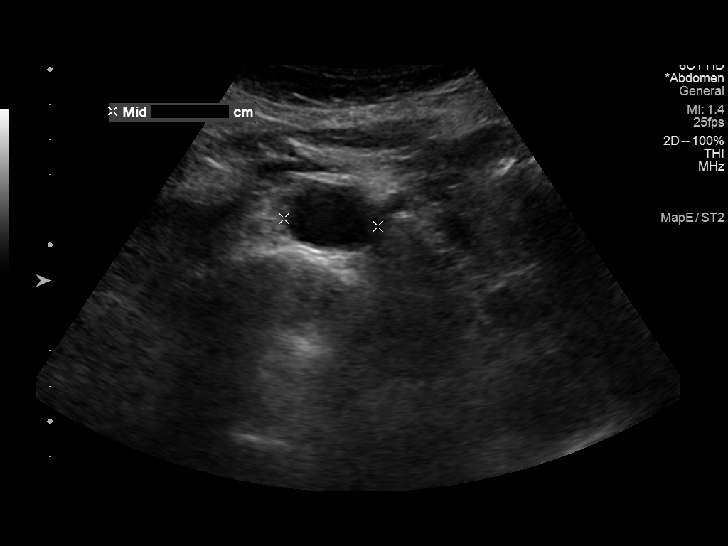
[im 11/28]
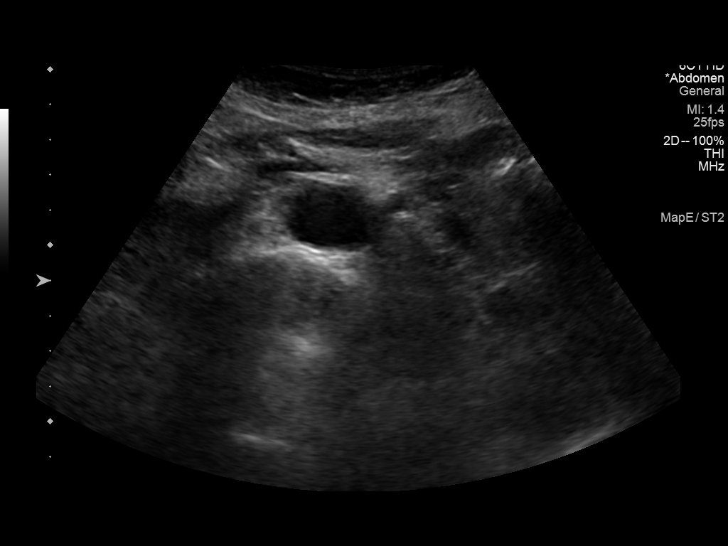
[im 13/28]
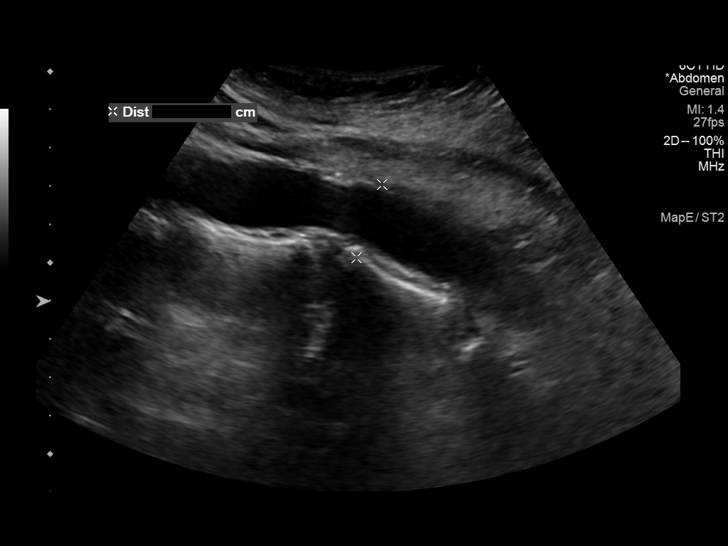
[im 15/28]
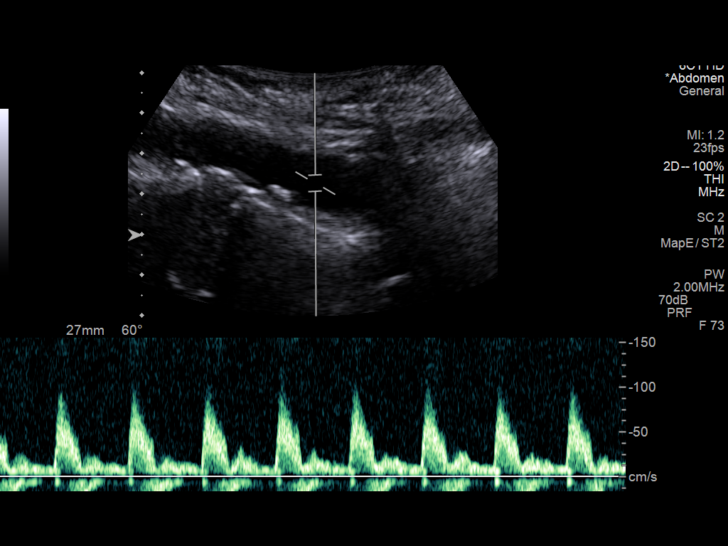
[im 17/28]
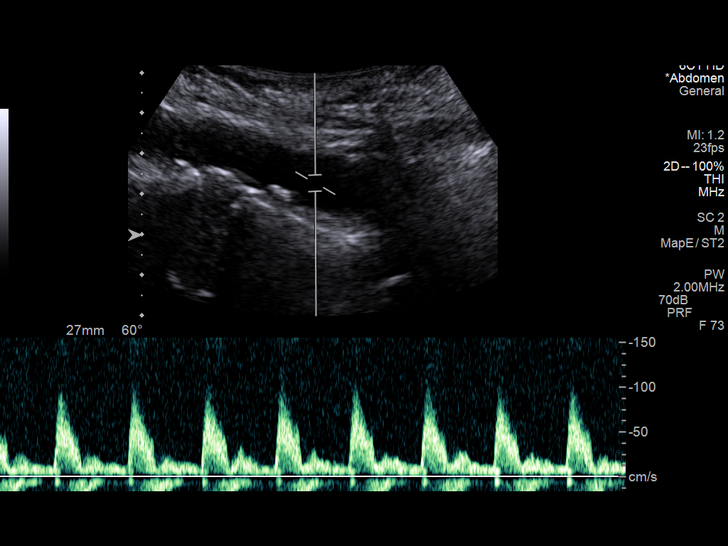
[im 19/28]
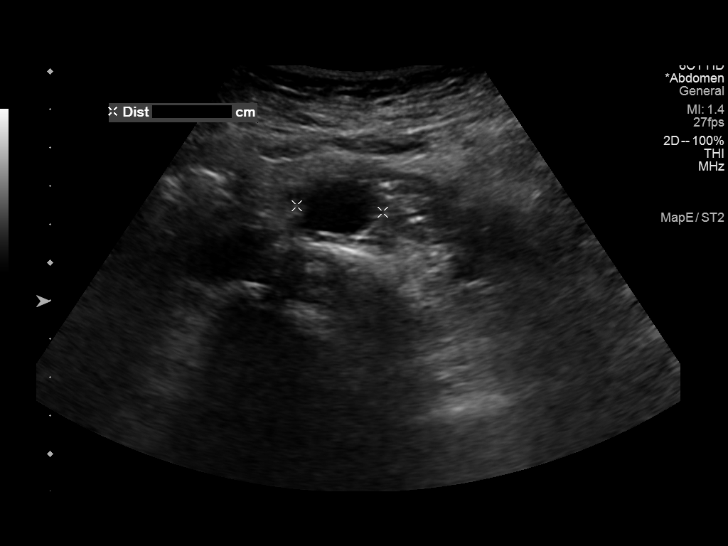
[im 21/28]
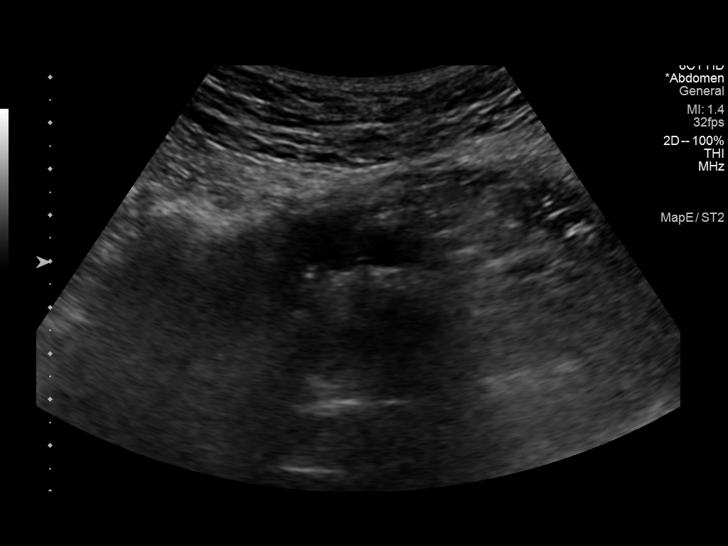
[im 23/28]
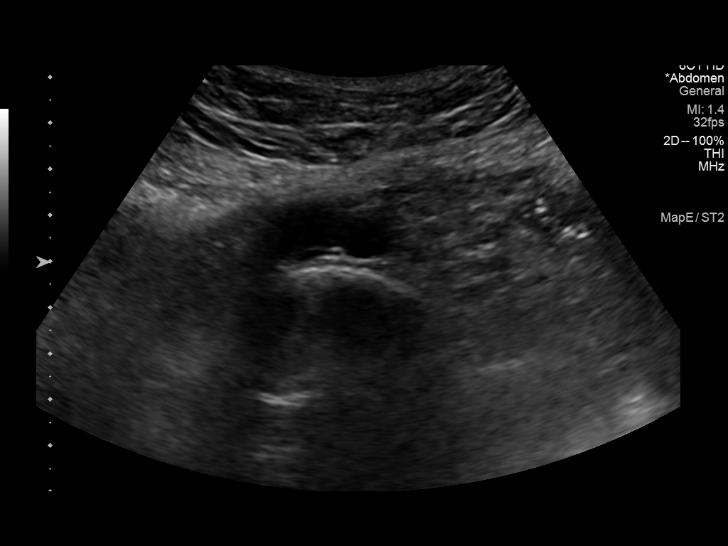
[im 25/28]
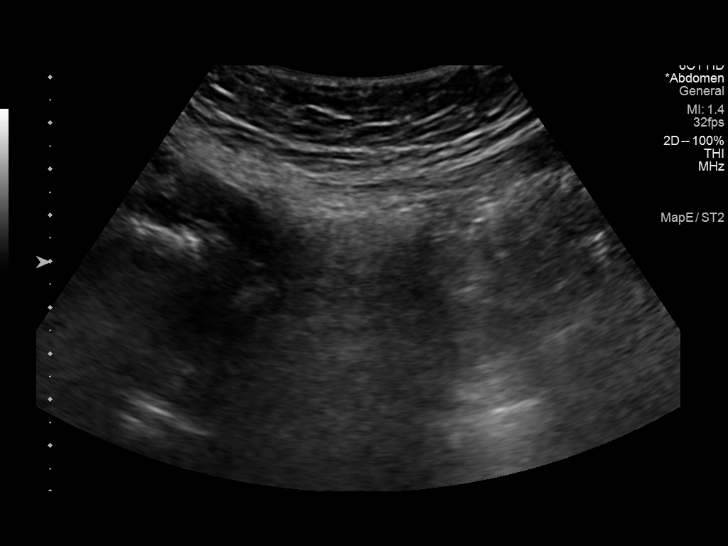
[im 28/28]
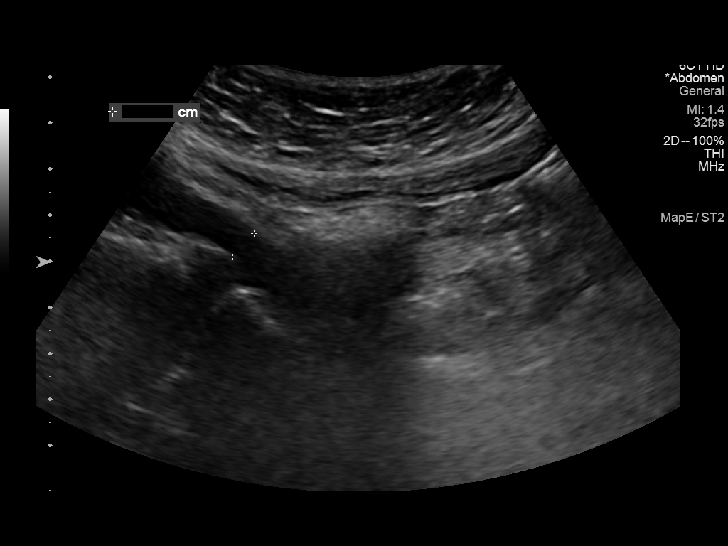

[14 of 25 positions shown; findings below may reference images not displayed]

FINDINGS: Abdominal atherosclerotic plaque noted.

Abdominal aortic measurements as follows:

Proximal:  2.4 cm

Mid:  2.3 cm

Distal:  2.0 cm
Patent: Yes, peak systolic velocity is 103.4 cm/s

Right common iliac artery: 1.0 cm

Left common iliac artery: 0.7 cm
IMPRESSION: No evidence of abdominal aortic aneurysm.

Aortic atherosclerotic plaque noted.

## 2020-04-30 DIAGNOSIS — Z23 Encounter for immunization: Secondary | ICD-10-CM | POA: Diagnosis not present

## 2020-06-10 DIAGNOSIS — L821 Other seborrheic keratosis: Secondary | ICD-10-CM | POA: Diagnosis not present

## 2020-06-10 DIAGNOSIS — D2261 Melanocytic nevi of right upper limb, including shoulder: Secondary | ICD-10-CM | POA: Diagnosis not present

## 2020-06-10 DIAGNOSIS — I788 Other diseases of capillaries: Secondary | ICD-10-CM | POA: Diagnosis not present

## 2020-06-10 DIAGNOSIS — L814 Other melanin hyperpigmentation: Secondary | ICD-10-CM | POA: Diagnosis not present

## 2020-06-10 DIAGNOSIS — L820 Inflamed seborrheic keratosis: Secondary | ICD-10-CM | POA: Diagnosis not present

## 2020-06-10 DIAGNOSIS — D1801 Hemangioma of skin and subcutaneous tissue: Secondary | ICD-10-CM | POA: Diagnosis not present

## 2020-06-10 DIAGNOSIS — D2262 Melanocytic nevi of left upper limb, including shoulder: Secondary | ICD-10-CM | POA: Diagnosis not present

## 2020-06-10 DIAGNOSIS — D225 Melanocytic nevi of trunk: Secondary | ICD-10-CM | POA: Diagnosis not present

## 2020-09-08 DIAGNOSIS — Z1231 Encounter for screening mammogram for malignant neoplasm of breast: Secondary | ICD-10-CM | POA: Diagnosis not present

## 2020-09-13 DIAGNOSIS — N952 Postmenopausal atrophic vaginitis: Secondary | ICD-10-CM | POA: Diagnosis not present

## 2020-12-09 DIAGNOSIS — H52223 Regular astigmatism, bilateral: Secondary | ICD-10-CM | POA: Diagnosis not present

## 2020-12-09 DIAGNOSIS — H524 Presbyopia: Secondary | ICD-10-CM | POA: Diagnosis not present

## 2020-12-09 DIAGNOSIS — H5203 Hypermetropia, bilateral: Secondary | ICD-10-CM | POA: Diagnosis not present

## 2020-12-09 DIAGNOSIS — H43813 Vitreous degeneration, bilateral: Secondary | ICD-10-CM | POA: Diagnosis not present

## 2020-12-09 DIAGNOSIS — H2513 Age-related nuclear cataract, bilateral: Secondary | ICD-10-CM | POA: Diagnosis not present

## 2021-02-14 DIAGNOSIS — Z23 Encounter for immunization: Secondary | ICD-10-CM | POA: Diagnosis not present

## 2021-03-21 DIAGNOSIS — M545 Low back pain, unspecified: Secondary | ICD-10-CM | POA: Diagnosis not present

## 2021-03-21 DIAGNOSIS — M25552 Pain in left hip: Secondary | ICD-10-CM | POA: Diagnosis not present

## 2021-03-22 DIAGNOSIS — N390 Urinary tract infection, site not specified: Secondary | ICD-10-CM | POA: Diagnosis not present

## 2021-03-22 DIAGNOSIS — R300 Dysuria: Secondary | ICD-10-CM | POA: Diagnosis not present

## 2021-03-22 DIAGNOSIS — M25511 Pain in right shoulder: Secondary | ICD-10-CM | POA: Diagnosis not present

## 2021-03-22 DIAGNOSIS — Z0000 Encounter for general adult medical examination without abnormal findings: Secondary | ICD-10-CM | POA: Diagnosis not present

## 2021-03-22 DIAGNOSIS — R0989 Other specified symptoms and signs involving the circulatory and respiratory systems: Secondary | ICD-10-CM | POA: Diagnosis not present

## 2021-03-22 DIAGNOSIS — E2839 Other primary ovarian failure: Secondary | ICD-10-CM | POA: Diagnosis not present

## 2021-03-22 DIAGNOSIS — Z1389 Encounter for screening for other disorder: Secondary | ICD-10-CM | POA: Diagnosis not present

## 2021-03-22 DIAGNOSIS — E7800 Pure hypercholesterolemia, unspecified: Secondary | ICD-10-CM | POA: Diagnosis not present

## 2021-03-22 DIAGNOSIS — K219 Gastro-esophageal reflux disease without esophagitis: Secondary | ICD-10-CM | POA: Diagnosis not present

## 2021-03-22 DIAGNOSIS — M25552 Pain in left hip: Secondary | ICD-10-CM | POA: Diagnosis not present

## 2021-03-22 DIAGNOSIS — M8580 Other specified disorders of bone density and structure, unspecified site: Secondary | ICD-10-CM | POA: Diagnosis not present

## 2021-03-29 ENCOUNTER — Other Ambulatory Visit: Payer: Self-pay | Admitting: Family Medicine

## 2021-03-29 DIAGNOSIS — E2839 Other primary ovarian failure: Secondary | ICD-10-CM

## 2021-06-14 DIAGNOSIS — D2372 Other benign neoplasm of skin of left lower limb, including hip: Secondary | ICD-10-CM | POA: Diagnosis not present

## 2021-06-14 DIAGNOSIS — M79672 Pain in left foot: Secondary | ICD-10-CM | POA: Diagnosis not present

## 2021-06-15 DIAGNOSIS — D2261 Melanocytic nevi of right upper limb, including shoulder: Secondary | ICD-10-CM | POA: Diagnosis not present

## 2021-06-15 DIAGNOSIS — L918 Other hypertrophic disorders of the skin: Secondary | ICD-10-CM | POA: Diagnosis not present

## 2021-06-15 DIAGNOSIS — L819 Disorder of pigmentation, unspecified: Secondary | ICD-10-CM | POA: Diagnosis not present

## 2021-06-15 DIAGNOSIS — D225 Melanocytic nevi of trunk: Secondary | ICD-10-CM | POA: Diagnosis not present

## 2021-06-15 DIAGNOSIS — L821 Other seborrheic keratosis: Secondary | ICD-10-CM | POA: Diagnosis not present

## 2021-06-15 DIAGNOSIS — D1801 Hemangioma of skin and subcutaneous tissue: Secondary | ICD-10-CM | POA: Diagnosis not present

## 2021-06-15 DIAGNOSIS — L814 Other melanin hyperpigmentation: Secondary | ICD-10-CM | POA: Diagnosis not present

## 2021-07-03 DIAGNOSIS — Z20822 Contact with and (suspected) exposure to covid-19: Secondary | ICD-10-CM | POA: Diagnosis not present

## 2021-07-16 DIAGNOSIS — Z20822 Contact with and (suspected) exposure to covid-19: Secondary | ICD-10-CM | POA: Diagnosis not present

## 2021-08-15 DIAGNOSIS — Z20822 Contact with and (suspected) exposure to covid-19: Secondary | ICD-10-CM | POA: Diagnosis not present

## 2021-08-16 DIAGNOSIS — Z1152 Encounter for screening for COVID-19: Secondary | ICD-10-CM | POA: Diagnosis not present

## 2021-08-16 DIAGNOSIS — Z20828 Contact with and (suspected) exposure to other viral communicable diseases: Secondary | ICD-10-CM | POA: Diagnosis not present

## 2021-08-22 DIAGNOSIS — Z20822 Contact with and (suspected) exposure to covid-19: Secondary | ICD-10-CM | POA: Diagnosis not present

## 2021-08-24 DIAGNOSIS — Z20822 Contact with and (suspected) exposure to covid-19: Secondary | ICD-10-CM | POA: Diagnosis not present

## 2021-08-26 DIAGNOSIS — Z20822 Contact with and (suspected) exposure to covid-19: Secondary | ICD-10-CM | POA: Diagnosis not present

## 2021-09-02 DIAGNOSIS — Z20822 Contact with and (suspected) exposure to covid-19: Secondary | ICD-10-CM | POA: Diagnosis not present

## 2021-09-03 DIAGNOSIS — Z20822 Contact with and (suspected) exposure to covid-19: Secondary | ICD-10-CM | POA: Diagnosis not present

## 2021-09-06 DIAGNOSIS — Z23 Encounter for immunization: Secondary | ICD-10-CM | POA: Diagnosis not present

## 2021-09-07 DIAGNOSIS — Z20828 Contact with and (suspected) exposure to other viral communicable diseases: Secondary | ICD-10-CM | POA: Diagnosis not present

## 2021-09-08 ENCOUNTER — Ambulatory Visit
Admission: RE | Admit: 2021-09-08 | Discharge: 2021-09-08 | Disposition: A | Discharge status: Home / Self Care | Payer: Medicare Other | Source: Ambulatory Visit | Attending: Family Medicine | Admitting: Family Medicine

## 2021-09-08 DIAGNOSIS — Z780 Asymptomatic menopausal state: Secondary | ICD-10-CM | POA: Diagnosis not present

## 2021-09-08 DIAGNOSIS — E2839 Other primary ovarian failure: Secondary | ICD-10-CM

## 2021-09-08 DIAGNOSIS — M85852 Other specified disorders of bone density and structure, left thigh: Secondary | ICD-10-CM | POA: Diagnosis not present

## 2021-09-12 DIAGNOSIS — Z139 Encounter for screening, unspecified: Secondary | ICD-10-CM | POA: Diagnosis not present

## 2021-09-12 DIAGNOSIS — Z1231 Encounter for screening mammogram for malignant neoplasm of breast: Secondary | ICD-10-CM | POA: Diagnosis not present

## 2021-09-21 DIAGNOSIS — M5459 Other low back pain: Secondary | ICD-10-CM | POA: Diagnosis not present

## 2021-09-21 DIAGNOSIS — M25552 Pain in left hip: Secondary | ICD-10-CM | POA: Diagnosis not present

## 2021-10-03 DIAGNOSIS — Z20828 Contact with and (suspected) exposure to other viral communicable diseases: Secondary | ICD-10-CM | POA: Diagnosis not present

## 2021-10-06 DIAGNOSIS — M5459 Other low back pain: Secondary | ICD-10-CM | POA: Diagnosis not present

## 2021-10-06 DIAGNOSIS — M25552 Pain in left hip: Secondary | ICD-10-CM | POA: Diagnosis not present

## 2021-10-10 DIAGNOSIS — M25552 Pain in left hip: Secondary | ICD-10-CM | POA: Diagnosis not present

## 2021-10-10 DIAGNOSIS — M5459 Other low back pain: Secondary | ICD-10-CM | POA: Diagnosis not present

## 2021-10-12 DIAGNOSIS — M5459 Other low back pain: Secondary | ICD-10-CM | POA: Diagnosis not present

## 2021-10-12 DIAGNOSIS — M25552 Pain in left hip: Secondary | ICD-10-CM | POA: Diagnosis not present

## 2021-10-19 DIAGNOSIS — M25552 Pain in left hip: Secondary | ICD-10-CM | POA: Diagnosis not present

## 2021-10-19 DIAGNOSIS — M5459 Other low back pain: Secondary | ICD-10-CM | POA: Diagnosis not present

## 2021-11-02 DIAGNOSIS — M25552 Pain in left hip: Secondary | ICD-10-CM | POA: Diagnosis not present

## 2021-11-02 DIAGNOSIS — M5459 Other low back pain: Secondary | ICD-10-CM | POA: Diagnosis not present

## 2021-11-08 DIAGNOSIS — M5459 Other low back pain: Secondary | ICD-10-CM | POA: Diagnosis not present

## 2021-11-08 DIAGNOSIS — M25552 Pain in left hip: Secondary | ICD-10-CM | POA: Diagnosis not present

## 2021-11-10 DIAGNOSIS — M5459 Other low back pain: Secondary | ICD-10-CM | POA: Diagnosis not present

## 2021-11-10 DIAGNOSIS — M25552 Pain in left hip: Secondary | ICD-10-CM | POA: Diagnosis not present

## 2021-11-17 DIAGNOSIS — M25552 Pain in left hip: Secondary | ICD-10-CM | POA: Diagnosis not present

## 2021-11-17 DIAGNOSIS — M5459 Other low back pain: Secondary | ICD-10-CM | POA: Diagnosis not present

## 2021-11-22 DIAGNOSIS — M5459 Other low back pain: Secondary | ICD-10-CM | POA: Diagnosis not present

## 2021-11-22 DIAGNOSIS — M25552 Pain in left hip: Secondary | ICD-10-CM | POA: Diagnosis not present

## 2021-11-24 DIAGNOSIS — M5459 Other low back pain: Secondary | ICD-10-CM | POA: Diagnosis not present

## 2021-11-24 DIAGNOSIS — M25552 Pain in left hip: Secondary | ICD-10-CM | POA: Diagnosis not present

## 2021-12-05 DIAGNOSIS — H2513 Age-related nuclear cataract, bilateral: Secondary | ICD-10-CM | POA: Diagnosis not present

## 2021-12-05 DIAGNOSIS — H52223 Regular astigmatism, bilateral: Secondary | ICD-10-CM | POA: Diagnosis not present

## 2021-12-05 DIAGNOSIS — H43813 Vitreous degeneration, bilateral: Secondary | ICD-10-CM | POA: Diagnosis not present

## 2021-12-05 DIAGNOSIS — H524 Presbyopia: Secondary | ICD-10-CM | POA: Diagnosis not present

## 2021-12-05 DIAGNOSIS — H5203 Hypermetropia, bilateral: Secondary | ICD-10-CM | POA: Diagnosis not present

## 2022-02-10 DIAGNOSIS — Z23 Encounter for immunization: Secondary | ICD-10-CM | POA: Diagnosis not present

## 2022-03-01 DIAGNOSIS — M25552 Pain in left hip: Secondary | ICD-10-CM | POA: Diagnosis not present

## 2022-03-13 DIAGNOSIS — M7062 Trochanteric bursitis, left hip: Secondary | ICD-10-CM | POA: Diagnosis not present

## 2022-03-13 DIAGNOSIS — M7072 Other bursitis of hip, left hip: Secondary | ICD-10-CM | POA: Diagnosis not present

## 2022-04-05 DIAGNOSIS — Z0000 Encounter for general adult medical examination without abnormal findings: Secondary | ICD-10-CM | POA: Diagnosis not present

## 2022-04-05 DIAGNOSIS — Z23 Encounter for immunization: Secondary | ICD-10-CM | POA: Diagnosis not present

## 2022-04-05 DIAGNOSIS — M8580 Other specified disorders of bone density and structure, unspecified site: Secondary | ICD-10-CM | POA: Diagnosis not present

## 2022-04-05 DIAGNOSIS — E7800 Pure hypercholesterolemia, unspecified: Secondary | ICD-10-CM | POA: Diagnosis not present

## 2022-04-05 DIAGNOSIS — K219 Gastro-esophageal reflux disease without esophagitis: Secondary | ICD-10-CM | POA: Diagnosis not present

## 2022-04-05 DIAGNOSIS — E2839 Other primary ovarian failure: Secondary | ICD-10-CM | POA: Diagnosis not present

## 2022-04-30 DIAGNOSIS — Z20828 Contact with and (suspected) exposure to other viral communicable diseases: Secondary | ICD-10-CM | POA: Diagnosis not present

## 2022-06-05 DIAGNOSIS — M7711 Lateral epicondylitis, right elbow: Secondary | ICD-10-CM | POA: Diagnosis not present

## 2022-07-10 DIAGNOSIS — D2239 Melanocytic nevi of other parts of face: Secondary | ICD-10-CM | POA: Diagnosis not present

## 2022-07-10 DIAGNOSIS — L720 Epidermal cyst: Secondary | ICD-10-CM | POA: Diagnosis not present

## 2022-07-10 DIAGNOSIS — L821 Other seborrheic keratosis: Secondary | ICD-10-CM | POA: Diagnosis not present

## 2022-07-10 DIAGNOSIS — D485 Neoplasm of uncertain behavior of skin: Secondary | ICD-10-CM | POA: Diagnosis not present

## 2022-07-10 DIAGNOSIS — D225 Melanocytic nevi of trunk: Secondary | ICD-10-CM | POA: Diagnosis not present

## 2022-07-10 DIAGNOSIS — M25521 Pain in right elbow: Secondary | ICD-10-CM | POA: Diagnosis not present

## 2022-07-10 DIAGNOSIS — D3613 Benign neoplasm of peripheral nerves and autonomic nervous system of lower limb, including hip: Secondary | ICD-10-CM | POA: Diagnosis not present

## 2022-09-14 DIAGNOSIS — Z1231 Encounter for screening mammogram for malignant neoplasm of breast: Secondary | ICD-10-CM | POA: Diagnosis not present

## 2022-09-14 DIAGNOSIS — R92323 Mammographic fibroglandular density, bilateral breasts: Secondary | ICD-10-CM | POA: Diagnosis not present

## 2022-10-05 DIAGNOSIS — Z01419 Encounter for gynecological examination (general) (routine) without abnormal findings: Secondary | ICD-10-CM | POA: Diagnosis not present

## 2022-12-27 DIAGNOSIS — H2513 Age-related nuclear cataract, bilateral: Secondary | ICD-10-CM | POA: Diagnosis not present

## 2022-12-27 DIAGNOSIS — H43813 Vitreous degeneration, bilateral: Secondary | ICD-10-CM | POA: Diagnosis not present

## 2022-12-27 DIAGNOSIS — H40033 Anatomical narrow angle, bilateral: Secondary | ICD-10-CM | POA: Diagnosis not present

## 2022-12-27 DIAGNOSIS — H524 Presbyopia: Secondary | ICD-10-CM | POA: Diagnosis not present

## 2023-02-06 DIAGNOSIS — Z23 Encounter for immunization: Secondary | ICD-10-CM | POA: Diagnosis not present

## 2023-03-21 DIAGNOSIS — M25551 Pain in right hip: Secondary | ICD-10-CM | POA: Diagnosis not present

## 2023-03-21 DIAGNOSIS — M25561 Pain in right knee: Secondary | ICD-10-CM | POA: Diagnosis not present

## 2023-04-04 DIAGNOSIS — M25551 Pain in right hip: Secondary | ICD-10-CM | POA: Diagnosis not present

## 2023-04-04 DIAGNOSIS — M25561 Pain in right knee: Secondary | ICD-10-CM | POA: Diagnosis not present

## 2023-04-11 DIAGNOSIS — M25561 Pain in right knee: Secondary | ICD-10-CM | POA: Diagnosis not present

## 2023-04-11 DIAGNOSIS — M25551 Pain in right hip: Secondary | ICD-10-CM | POA: Diagnosis not present

## 2023-04-18 DIAGNOSIS — M25551 Pain in right hip: Secondary | ICD-10-CM | POA: Diagnosis not present

## 2023-04-18 DIAGNOSIS — M25561 Pain in right knee: Secondary | ICD-10-CM | POA: Diagnosis not present

## 2023-04-30 DIAGNOSIS — M25561 Pain in right knee: Secondary | ICD-10-CM | POA: Diagnosis not present

## 2023-04-30 DIAGNOSIS — M25551 Pain in right hip: Secondary | ICD-10-CM | POA: Diagnosis not present

## 2023-05-03 DIAGNOSIS — M25561 Pain in right knee: Secondary | ICD-10-CM | POA: Diagnosis not present

## 2023-05-03 DIAGNOSIS — M25551 Pain in right hip: Secondary | ICD-10-CM | POA: Diagnosis not present

## 2023-05-07 DIAGNOSIS — M7061 Trochanteric bursitis, right hip: Secondary | ICD-10-CM | POA: Diagnosis not present

## 2023-05-30 DIAGNOSIS — Z0000 Encounter for general adult medical examination without abnormal findings: Secondary | ICD-10-CM | POA: Diagnosis not present

## 2023-05-30 DIAGNOSIS — K219 Gastro-esophageal reflux disease without esophagitis: Secondary | ICD-10-CM | POA: Diagnosis not present

## 2023-05-30 DIAGNOSIS — R1900 Intra-abdominal and pelvic swelling, mass and lump, unspecified site: Secondary | ICD-10-CM | POA: Diagnosis not present

## 2023-05-30 DIAGNOSIS — M85852 Other specified disorders of bone density and structure, left thigh: Secondary | ICD-10-CM | POA: Diagnosis not present

## 2023-05-30 DIAGNOSIS — M8580 Other specified disorders of bone density and structure, unspecified site: Secondary | ICD-10-CM | POA: Diagnosis not present

## 2023-05-30 DIAGNOSIS — I700 Atherosclerosis of aorta: Secondary | ICD-10-CM | POA: Diagnosis not present

## 2023-05-30 DIAGNOSIS — I351 Nonrheumatic aortic (valve) insufficiency: Secondary | ICD-10-CM | POA: Diagnosis not present

## 2023-05-30 DIAGNOSIS — E7800 Pure hypercholesterolemia, unspecified: Secondary | ICD-10-CM | POA: Diagnosis not present

## 2023-06-22 DIAGNOSIS — R1900 Intra-abdominal and pelvic swelling, mass and lump, unspecified site: Secondary | ICD-10-CM | POA: Diagnosis not present

## 2023-06-22 DIAGNOSIS — I351 Nonrheumatic aortic (valve) insufficiency: Secondary | ICD-10-CM | POA: Diagnosis not present

## 2023-06-29 DIAGNOSIS — L820 Inflamed seborrheic keratosis: Secondary | ICD-10-CM | POA: Diagnosis not present

## 2023-06-29 DIAGNOSIS — L813 Cafe au lait spots: Secondary | ICD-10-CM | POA: Diagnosis not present

## 2023-07-17 DIAGNOSIS — D3613 Benign neoplasm of peripheral nerves and autonomic nervous system of lower limb, including hip: Secondary | ICD-10-CM | POA: Diagnosis not present

## 2023-07-17 DIAGNOSIS — D1801 Hemangioma of skin and subcutaneous tissue: Secondary | ICD-10-CM | POA: Diagnosis not present

## 2023-07-17 DIAGNOSIS — L821 Other seborrheic keratosis: Secondary | ICD-10-CM | POA: Diagnosis not present

## 2023-07-17 DIAGNOSIS — L905 Scar conditions and fibrosis of skin: Secondary | ICD-10-CM | POA: Diagnosis not present

## 2023-07-30 DIAGNOSIS — E7800 Pure hypercholesterolemia, unspecified: Secondary | ICD-10-CM | POA: Diagnosis not present

## 2023-08-02 DIAGNOSIS — I700 Atherosclerosis of aorta: Secondary | ICD-10-CM | POA: Diagnosis not present

## 2023-08-29 DIAGNOSIS — I700 Atherosclerosis of aorta: Secondary | ICD-10-CM | POA: Diagnosis not present

## 2023-08-29 DIAGNOSIS — E7800 Pure hypercholesterolemia, unspecified: Secondary | ICD-10-CM | POA: Diagnosis not present

## 2023-08-31 DIAGNOSIS — I700 Atherosclerosis of aorta: Secondary | ICD-10-CM | POA: Diagnosis not present

## 2023-09-17 DIAGNOSIS — R92323 Mammographic fibroglandular density, bilateral breasts: Secondary | ICD-10-CM | POA: Diagnosis not present

## 2023-09-17 DIAGNOSIS — Z1231 Encounter for screening mammogram for malignant neoplasm of breast: Secondary | ICD-10-CM | POA: Diagnosis not present

## 2023-09-29 DIAGNOSIS — I700 Atherosclerosis of aorta: Secondary | ICD-10-CM | POA: Diagnosis not present

## 2023-09-29 DIAGNOSIS — E7800 Pure hypercholesterolemia, unspecified: Secondary | ICD-10-CM | POA: Diagnosis not present

## 2023-09-30 DIAGNOSIS — I700 Atherosclerosis of aorta: Secondary | ICD-10-CM | POA: Diagnosis not present

## 2023-10-29 DIAGNOSIS — E7800 Pure hypercholesterolemia, unspecified: Secondary | ICD-10-CM | POA: Diagnosis not present

## 2023-10-29 DIAGNOSIS — I700 Atherosclerosis of aorta: Secondary | ICD-10-CM | POA: Diagnosis not present

## 2023-11-29 DIAGNOSIS — I700 Atherosclerosis of aorta: Secondary | ICD-10-CM | POA: Diagnosis not present

## 2023-11-29 DIAGNOSIS — E7800 Pure hypercholesterolemia, unspecified: Secondary | ICD-10-CM | POA: Diagnosis not present

## 2023-12-29 DIAGNOSIS — I700 Atherosclerosis of aorta: Secondary | ICD-10-CM | POA: Diagnosis not present

## 2023-12-30 DIAGNOSIS — I700 Atherosclerosis of aorta: Secondary | ICD-10-CM | POA: Diagnosis not present

## 2023-12-30 DIAGNOSIS — E7800 Pure hypercholesterolemia, unspecified: Secondary | ICD-10-CM | POA: Diagnosis not present

## 2024-01-08 DIAGNOSIS — H40033 Anatomical narrow angle, bilateral: Secondary | ICD-10-CM | POA: Diagnosis not present

## 2024-01-08 DIAGNOSIS — D485 Neoplasm of uncertain behavior of skin: Secondary | ICD-10-CM | POA: Diagnosis not present

## 2024-01-08 DIAGNOSIS — H43813 Vitreous degeneration, bilateral: Secondary | ICD-10-CM | POA: Diagnosis not present

## 2024-01-08 DIAGNOSIS — H2513 Age-related nuclear cataract, bilateral: Secondary | ICD-10-CM | POA: Diagnosis not present

## 2024-01-08 DIAGNOSIS — H524 Presbyopia: Secondary | ICD-10-CM | POA: Diagnosis not present

## 2024-01-28 DIAGNOSIS — I700 Atherosclerosis of aorta: Secondary | ICD-10-CM | POA: Diagnosis not present

## 2024-01-29 DIAGNOSIS — E7800 Pure hypercholesterolemia, unspecified: Secondary | ICD-10-CM | POA: Diagnosis not present

## 2024-01-29 DIAGNOSIS — I700 Atherosclerosis of aorta: Secondary | ICD-10-CM | POA: Diagnosis not present

## 2024-02-01 DIAGNOSIS — Z23 Encounter for immunization: Secondary | ICD-10-CM | POA: Diagnosis not present

## 2024-02-27 DIAGNOSIS — I700 Atherosclerosis of aorta: Secondary | ICD-10-CM | POA: Diagnosis not present

## 2024-02-29 DIAGNOSIS — E7800 Pure hypercholesterolemia, unspecified: Secondary | ICD-10-CM | POA: Diagnosis not present

## 2024-02-29 DIAGNOSIS — I700 Atherosclerosis of aorta: Secondary | ICD-10-CM | POA: Diagnosis not present

## 2024-03-21 DIAGNOSIS — M25461 Effusion, right knee: Secondary | ICD-10-CM | POA: Diagnosis not present

## 2024-03-21 DIAGNOSIS — M25561 Pain in right knee: Secondary | ICD-10-CM | POA: Diagnosis not present

## 2024-03-28 DIAGNOSIS — I700 Atherosclerosis of aorta: Secondary | ICD-10-CM | POA: Diagnosis not present

## 2024-03-30 DIAGNOSIS — I700 Atherosclerosis of aorta: Secondary | ICD-10-CM | POA: Diagnosis not present

## 2024-03-30 DIAGNOSIS — E7800 Pure hypercholesterolemia, unspecified: Secondary | ICD-10-CM | POA: Diagnosis not present

## 2024-04-02 ENCOUNTER — Encounter: Admitting: Nutrition

## 2024-04-02 VITALS — Ht 67.0 in | Wt 154.0 lb

## 2024-04-02 DIAGNOSIS — E7800 Pure hypercholesterolemia, unspecified: Secondary | ICD-10-CM | POA: Insufficient documentation

## 2024-04-02 NOTE — Progress Notes (Signed)
 Medical Nutrition Therapy  Appointment Start time:  [phone]  Appointment End time:  1100 Primary concerns today: Hyperlipidemai  Referral diagnosis: E78.0 Preferred learning style: NO Preference Learning readiness:  (not ready, contemplating, ready, change in progress)   NUTRITION ASSESSMENT  73 yr old wfemale referred for hyperlipidemia with aortic stenosis. Sees. Dr. Teresa. LDL is 86 mg/dl. Not on BP medications. Is on Prevachol. She would like to learn about what foods to eat to lower LDL and work on reversing her CVD.  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 Walks 2 miles most every day. Healthy weight BMI 24. Cooks and eats most meals at home.  She is familiar with a whole plant based diet some from her son and daughter in law. Has heard of [employer] over [employer]. Willing to work on focusing on more of a whole plant based lifestyle.   Clinical Medical Hx:  Past Medical History:  Diagnosis Date   High cholesterol     Medications:  Current Outpatient Medications on File Prior to Visit  Medication Sig Dispense Refill   Calcium Citrate-Vitamin D (CITRACAL + D PO) Take 1 tablet by mouth 2 (two) times daily.     Cholecalciferol (VITAMIN D) 50 MCG (2000 UT) tablet Take 2,000 Units by mouth daily.     Flaxseed, Linseed, (FLAX SEED OIL) 1300 MG CAPS Take 1,300 mg by mouth 2 (two) times daily.     Inulin (FIBER CHOICE PO) Take 1 tablet by mouth at bedtime.     Lutein 20 MG CAPS Take 20 mg by mouth daily.     Magnesium 250 MG TABS Take 250 mg by mouth at bedtime.     Multiple Vitamins-Minerals (CENTRUM SILVER PO) Take 1 tablet by mouth daily.     Omega-3 Fatty Acids (FISH OIL) 1200 MG CAPS Take 1,200 mg by mouth daily.     pravastatin (PRAVACHOL) 20 MG tablet Take 20 mg by mouth at bedtime.      Probiotic CAPS Take 1 capsule by mouth daily.     Zinc 25 MG TABS Take 25 mg by mouth daily.     naproxen sodium (ALEVE) 220 MG tablet Take 220 mg by mouth daily as needed (pain).     nystatin -triamcinolone   (MYCOLOG II) cream Apply cream to anal canal twice daily for 1 week and thereafter on as-needed basis. 30 g 1   OVER THE COUNTER MEDICATION Take 1 Dose by mouth daily. Lifevantage daily wellness powder     OVER THE COUNTER MEDICATION Take 1 tablet by mouth daily in the afternoon. lifevantage protandim nrf2     No current facility-administered medications on file prior to visit.    Labs:     Latest Ref Rng & Units 12/02/2016    2:08 PM  CMP  Glucose 65 - 99 mg/dL 878   BUN 6 - 20 mg/dL 11   Creatinine 9.55 - 1.00 mg/dL 9.22   Sodium 864 - 854 mmol/L 140   Potassium 3.5 - 5.1 mmol/L 3.8   Chloride 101 - 111 mmol/L 103   CO2 22 - 32 mmol/L 26   Calcium 8.9 - 10.3 mg/dL 9.6    Lipid Panel  No results found for: CHOL, TRIG, HDL, CHOLHDL, VLDL, LDLCALC, LDLDIRECT, LABVLDL  Notable Signs/Symptoms: none  Lifestyle & Dietary Hx Lives with husband Cooks mostly at home.  Estimated daily fluid intake: 60 oz Supplements: MVI, Calcium, Fiber, Omega 3 fish oil, Probiotic , VIt D Sleep: good Stress / self-care:  Current average weekly  physical activity: walks daily.  24-Hr Dietary Recall Eats 3 meals per day; Usually cereal and egg, lunch leftovers or sandwich dinner; meat and vegetables.  Estimated Energy Needs Calories: 1500-1600 Carbohydrate: 180g Protein: 120g Fat: 44g   NUTRITION DIAGNOSIS  NB-1.1 Food and nutrition-related knowledge deficit As related to Hyperlipidemia.  As evidenced by LDL elevated with aortic stenosis.SABRA   NUTRITION INTERVENTION  Nutrition education (E-1) on the following topics:   Lifestyle Medicine  - Whole Food, Plant Predominant Nutrition is highly recommended: Eat Plenty of vegetables, Mushrooms, fruits, Legumes, Whole Grains, Nuts, seeds in lieu of processed meats, processed snacks/pastries red meat, poultry, eggs.    -It is better to avoid simple carbohydrates including: Cakes, Sweet Desserts, Ice Cream, Soda (diet and regular),  Sweet Tea, Candies, Chips, Cookies, Store Bought Juices, Alcohol in Excess of  1-2 drinks a day, Lemonade,  Artificial Sweeteners, Doughnuts, Coffee Creamers, Sugar-free Products, etc, etc.  This is not a complete list.....  Exercise: If you are able: 30 -60 minutes a day ,4 days a week, or 150 minutes a week.  The longer the better.  Combine stretch, strength, and aerobic activities.  If you were told in the past that you have high risk for cardiovascular diseases, you may seek evaluation by your heart doctor prior to initiating moderate to intense exercise programs.  High Fiber diet, reading food labels. Handouts Provided Include  Lifestyle Medicine handouts Heart Healthy Nutrition Tips  Learning Style & Readiness for Change Teaching method utilized: Visual & Auditory  Demonstrated degree of understanding via: Teach Back  Barriers to learning/adherence to lifestyle change: none  Goals Established by Pt Follow a whole plant based diet as discussed Read food labels Cut out animal meat and dairy products Keep exericising Get in 28 grams of fiber per day from whole foods. Do the FULL Plate Living Program online.   MONITORING & EVALUATION Dietary intake, weekly physical activity,  in 1 month.  Next Steps  Patient is to work on eating more whole plant based foods and do the FULL PLate Living Program on line.SABRA

## 2024-04-15 ENCOUNTER — Encounter: Payer: Self-pay | Admitting: Nutrition

## 2024-04-15 NOTE — Patient Instructions (Addendum)
 Follow a whole plant based diet as discussed Read food labels Cut out animal meat and dairy products Keep exericising Get in 28 grams of fiber per day from whole foods. Do the FULL Plate Living Program online.

## 2024-04-16 ENCOUNTER — Encounter (HOSPITAL_COMMUNITY): Payer: Self-pay

## 2024-04-16 ENCOUNTER — Emergency Department (HOSPITAL_COMMUNITY): Admission: EM | Admit: 2024-04-16 | Discharge: 2024-04-17 | Disposition: A | Discharge status: Home / Self Care

## 2024-04-16 ENCOUNTER — Other Ambulatory Visit: Payer: Self-pay

## 2024-04-16 DIAGNOSIS — J101 Influenza due to other identified influenza virus with other respiratory manifestations: Secondary | ICD-10-CM | POA: Insufficient documentation

## 2024-04-16 DIAGNOSIS — R509 Fever, unspecified: Secondary | ICD-10-CM | POA: Diagnosis present

## 2024-04-16 LAB — URINALYSIS, W/ REFLEX TO CULTURE (INFECTION SUSPECTED)
Bacteria, UA: NONE SEEN
Bilirubin Urine: NEGATIVE
Glucose, UA: NEGATIVE mg/dL
Hgb urine dipstick: NEGATIVE
Ketones, ur: NEGATIVE mg/dL
Nitrite: NEGATIVE
Protein, ur: NEGATIVE mg/dL
Specific Gravity, Urine: 1.011 (ref 1.005–1.030)
pH: 6 (ref 5.0–8.0)

## 2024-04-16 LAB — RESP PANEL BY RT-PCR (RSV, FLU A&B, COVID)  RVPGX2
Influenza A by PCR: POSITIVE — AB
Influenza B by PCR: NEGATIVE
Resp Syncytial Virus by PCR: NEGATIVE
SARS Coronavirus 2 by RT PCR: NEGATIVE

## 2024-04-16 NOTE — ED Triage Notes (Signed)
 Pt to ED from home with c/o fever, nasal drainage, that started tonight, pt denies any other symptoms. Pt denies urinary changes. Pt took tylenol and temp decreased.

## 2024-04-16 NOTE — ED Provider Notes (Signed)
 [facility] AT [facility] Provider Note   CSN: [id number] Arrival date & time: 04/16/24  2223     Patient presents with: Fever   Crystal Salas is a 73 y.o. female.  {Add pertinent medical, surgical, social history, OB history to HPI:5929} 73 year old female presents for evaluation of fever.  States yesterday it was 103 and then today was elevated again.  She states she took Tylenol and it improved.  States she does have a runny nose but denies any other symptoms or concerns.  States she has had a kidney infection before and was concerned she may have another 1.  Denies any dysuria or increased urinary frequency.  Denies any headaches, back pain, cough, nausea, vomiting,, or any other symptoms.   Fever Associated symptoms: no chest pain, no chills, no cough, no dysuria, no ear pain, no rash, no sore throat and no vomiting        Prior to Admission medications  Medication Sig Start Date End Date Taking? Authorizing Provider  Calcium Citrate-Vitamin D (CITRACAL + D PO) Take 1 tablet by mouth 2 (two) times daily.    [provider]  Cholecalciferol (VITAMIN D) 50 MCG (2000 UT) tablet Take 2,000 Units by mouth daily.    [provider]  Flaxseed, Linseed, (FLAX SEED OIL) 1300 MG CAPS Take 1,300 mg by mouth 2 (two) times daily.    [provider]  Inulin (FIBER CHOICE PO) Take 1 tablet by mouth at bedtime.    [provider]  Lutein 20 MG CAPS Take 20 mg by mouth daily.    [provider]  Magnesium 250 MG TABS Take 250 mg by mouth at bedtime.    [provider]  Multiple Vitamins-Minerals (CENTRUM SILVER PO) Take 1 tablet by mouth daily.    [provider]  naproxen sodium (ALEVE) 220 MG tablet Take 220 mg by mouth daily as needed (pain).    [provider]  nystatin -triamcinolone  (MYCOLOG II) cream Apply cream to anal canal twice daily for 1 week and thereafter on as-needed  basis. 10/01/19   Rehman, Claudis PENNER, MD  Omega-3 Fatty Acids (FISH OIL) 1200 MG CAPS Take 1,200 mg by mouth daily.    [provider]  OVER THE COUNTER MEDICATION Take 1 Dose by mouth daily. Lifevantage daily wellness powder    [provider]  OVER THE COUNTER MEDICATION Take 1 tablet by mouth daily in the afternoon. lifevantage protandim nrf2    [provider]  pravastatin (PRAVACHOL) 20 MG tablet Take 20 mg by mouth at bedtime.     [provider]  Probiotic CAPS Take 1 capsule by mouth daily.    [provider]  Zinc 25 MG TABS Take 25 mg by mouth daily.    [provider]    Allergies: Erythromycin    Review of Systems  Constitutional:  Positive for fever. Negative for chills.  HENT:  Negative for ear pain and sore throat.   Eyes:  Negative for pain and visual disturbance.  Respiratory:  Negative for cough and shortness of breath.   Cardiovascular:  Negative for chest pain and palpitations.  Gastrointestinal:  Negative for abdominal pain and vomiting.  Genitourinary:  Negative for dysuria and hematuria.  Musculoskeletal:  Negative for arthralgias and back pain.  Skin:  Negative for color change and rash.  Neurological:  Negative for seizures and syncope.  All other systems reviewed and are negative.   Updated Vital Signs BP  127/69 (BP Location: Right Arm)   Pulse 90   Temp 99.4 F (37.4 C) (Oral)   Resp 18   Ht 5' 7 (1.702 m)   Wt 69.9 kg   SpO2 94%   BMI 24.12 kg/m   Physical Exam Vitals and nursing note reviewed.  Constitutional:      General: She is not in acute distress.    Appearance: Normal appearance. She is well-developed. She is not ill-appearing.  HENT:     Head: Normocephalic and atraumatic.  Eyes:     Conjunctiva/sclera: Conjunctivae normal.  Cardiovascular:     Rate and Rhythm: Normal rate and regular rhythm.     Heart sounds: No murmur heard. Pulmonary:     Effort: Pulmonary effort is normal. No  respiratory distress.     Breath sounds: Normal breath sounds.  Abdominal:     Palpations: Abdomen is soft.     Tenderness: There is no abdominal tenderness.  Musculoskeletal:        General: No swelling.     Cervical back: Neck supple.  Skin:    General: Skin is warm and dry.     Capillary Refill: Capillary refill takes less than 2 seconds.  Neurological:     General: No focal deficit present.     Mental Status: She is alert.  Psychiatric:        Mood and Affect: Mood normal.     (all labs ordered are listed, but only abnormal results are displayed) Labs Reviewed  RESP PANEL BY RT-PCR (RSV, FLU A&B, COVID)  [id number]  URINALYSIS, W/ REFLEX TO CULTURE (INFECTION SUSPECTED)    EKG: None  Radiology: No results found.  {Document cardiac monitor, telemetry assessment procedure when appropriate:32947} Procedures   Medications Ordered in the ED - No data to display    {Click here for ABCD2, HEART and other calculators REFRESH Note before signing:1}                              Medical Decision Making  ***  {Document critical care time when appropriate  Document review of labs and clinical decision tools ie [id number], etc  Document your independent review of radiology images and any outside records  Document your discussion with family members, caretakers and with consultants  Document social determinants of health affecting pt's care  Document your decision making why or why not admission, treatments were needed:32947:::1}   Final diagnoses:  None    ED Discharge Orders     None

## 2024-04-17 NOTE — ED Notes (Signed)
 Discharge instructions reviewed with patient. Patient questions answered and opportunity for education reviewed. Patient voices understanding of discharge instructions with no further questions. Patient ambulatory with steady gait to lobby.

## 2024-04-17 NOTE — Discharge Instructions (Signed)
 You can alternate Tylenol and Motrin every 3 hours as needed for pain and fever.  You can use any other over-the-counter medications as needed for your symptoms.  Your urinalysis is negative for infection.
